# Patient Record
Sex: Male | Born: 1965 | Race: Black or African American | Hispanic: No | Marital: Married | Smoking: Never smoker
Health system: Southern US, Community
[De-identification: ages and names within clinical notes are randomized; demographics above are authoritative.]

## PROBLEM LIST (undated history)

## (undated) DIAGNOSIS — E119 Type 2 diabetes mellitus without complications: Secondary | ICD-10-CM

## (undated) DIAGNOSIS — Z7689 Persons encountering health services in other specified circumstances: Secondary | ICD-10-CM

## (undated) DIAGNOSIS — R42 Dizziness and giddiness: Secondary | ICD-10-CM

## (undated) DIAGNOSIS — K625 Hemorrhage of anus and rectum: Secondary | ICD-10-CM

## (undated) DIAGNOSIS — I10 Essential (primary) hypertension: Secondary | ICD-10-CM

## (undated) DIAGNOSIS — M1990 Unspecified osteoarthritis, unspecified site: Secondary | ICD-10-CM

## (undated) HISTORY — PX: CIRCUMCISION: SUR203

## (undated) HISTORY — DX: Hemorrhage of anus and rectum: K62.5

---

## 2009-02-07 ENCOUNTER — Encounter: Payer: Self-pay | Admitting: Orthopedic Surgery

## 2009-02-07 ENCOUNTER — Ambulatory Visit (HOSPITAL_COMMUNITY): Admission: RE | Admit: 2009-02-07 | Discharge: 2009-02-07 | Payer: Self-pay | Admitting: Family Medicine

## 2009-03-14 ENCOUNTER — Ambulatory Visit: Payer: Self-pay | Admitting: Orthopedic Surgery

## 2009-03-14 DIAGNOSIS — M25519 Pain in unspecified shoulder: Secondary | ICD-10-CM | POA: Insufficient documentation

## 2010-06-13 ENCOUNTER — Ambulatory Visit (HOSPITAL_COMMUNITY)
Admission: RE | Admit: 2010-06-13 | Discharge: 2010-06-13 | Payer: Self-pay | Source: Home / Self Care | Attending: Family Medicine | Admitting: Family Medicine

## 2014-02-08 ENCOUNTER — Other Ambulatory Visit (HOSPITAL_COMMUNITY): Payer: Self-pay | Admitting: Family Medicine

## 2014-02-08 ENCOUNTER — Ambulatory Visit (HOSPITAL_COMMUNITY)
Admission: RE | Admit: 2014-02-08 | Discharge: 2014-02-08 | Disposition: A | Discharge status: Home / Self Care | Payer: BC Managed Care – PPO | Source: Ambulatory Visit | Attending: Family Medicine | Admitting: Family Medicine

## 2014-02-08 DIAGNOSIS — M79609 Pain in unspecified limb: Secondary | ICD-10-CM | POA: Diagnosis present

## 2014-02-08 DIAGNOSIS — M79672 Pain in left foot: Secondary | ICD-10-CM

## 2015-02-15 ENCOUNTER — Other Ambulatory Visit (HOSPITAL_COMMUNITY): Payer: Self-pay | Admitting: Internal Medicine

## 2015-02-15 DIAGNOSIS — R42 Dizziness and giddiness: Secondary | ICD-10-CM

## 2015-02-16 ENCOUNTER — Ambulatory Visit (HOSPITAL_COMMUNITY)
Admission: RE | Admit: 2015-02-16 | Discharge: 2015-02-16 | Disposition: A | Discharge status: Home / Self Care | Payer: BLUE CROSS/BLUE SHIELD | Source: Ambulatory Visit | Attending: Internal Medicine | Admitting: Internal Medicine

## 2015-02-16 DIAGNOSIS — R42 Dizziness and giddiness: Secondary | ICD-10-CM | POA: Diagnosis not present

## 2015-02-16 DIAGNOSIS — I6523 Occlusion and stenosis of bilateral carotid arteries: Secondary | ICD-10-CM | POA: Diagnosis not present

## 2015-05-22 ENCOUNTER — Emergency Department (HOSPITAL_COMMUNITY)
Admission: EM | Admit: 2015-05-22 | Discharge: 2015-05-22 | Disposition: A | Discharge status: Home / Self Care | Payer: BLUE CROSS/BLUE SHIELD | Attending: Emergency Medicine | Admitting: Emergency Medicine

## 2015-05-22 ENCOUNTER — Emergency Department (HOSPITAL_COMMUNITY): Payer: BLUE CROSS/BLUE SHIELD

## 2015-05-22 ENCOUNTER — Encounter (HOSPITAL_COMMUNITY): Payer: Self-pay | Admitting: Emergency Medicine

## 2015-05-22 DIAGNOSIS — Z7982 Long term (current) use of aspirin: Secondary | ICD-10-CM | POA: Insufficient documentation

## 2015-05-22 DIAGNOSIS — I10 Essential (primary) hypertension: Secondary | ICD-10-CM | POA: Diagnosis not present

## 2015-05-22 DIAGNOSIS — K5731 Diverticulosis of large intestine without perforation or abscess with bleeding: Secondary | ICD-10-CM | POA: Diagnosis not present

## 2015-05-22 DIAGNOSIS — Z79899 Other long term (current) drug therapy: Secondary | ICD-10-CM | POA: Diagnosis not present

## 2015-05-22 DIAGNOSIS — R197 Diarrhea, unspecified: Secondary | ICD-10-CM | POA: Diagnosis present

## 2015-05-22 DIAGNOSIS — N289 Disorder of kidney and ureter, unspecified: Secondary | ICD-10-CM | POA: Insufficient documentation

## 2015-05-22 DIAGNOSIS — K922 Gastrointestinal hemorrhage, unspecified: Secondary | ICD-10-CM

## 2015-05-22 DIAGNOSIS — K5730 Diverticulosis of large intestine without perforation or abscess without bleeding: Secondary | ICD-10-CM

## 2015-05-22 HISTORY — DX: Dizziness and giddiness: R42

## 2015-05-22 HISTORY — DX: Essential (primary) hypertension: I10

## 2015-05-22 LAB — COMPREHENSIVE METABOLIC PANEL
ALBUMIN: 4.1 g/dL (ref 3.5–5.0)
ALK PHOS: 39 U/L (ref 38–126)
ALT: 56 U/L (ref 17–63)
ANION GAP: 8 (ref 5–15)
AST: 44 U/L — ABNORMAL HIGH (ref 15–41)
BUN: 17 mg/dL (ref 6–20)
CALCIUM: 8.6 mg/dL — AB (ref 8.9–10.3)
CO2: 27 mmol/L (ref 22–32)
Chloride: 101 mmol/L (ref 101–111)
Creatinine, Ser: 2.08 mg/dL — ABNORMAL HIGH (ref 0.61–1.24)
GFR calc Af Amer: 42 mL/min — ABNORMAL LOW (ref >60)
GFR calc non Af Amer: 36 mL/min — ABNORMAL LOW (ref >60)
GLUCOSE: 153 mg/dL — AB (ref 65–99)
Potassium: 3.6 mmol/L (ref 3.5–5.1)
SODIUM: 136 mmol/L (ref 135–145)
Total Bilirubin: 0.8 mg/dL (ref 0.3–1.2)
Total Protein: 7.2 g/dL (ref 6.5–8.1)

## 2015-05-22 LAB — URINALYSIS, ROUTINE W REFLEX MICROSCOPIC
Bilirubin Urine: NEGATIVE
GLUCOSE, UA: NEGATIVE mg/dL
Hgb urine dipstick: NEGATIVE
KETONES UR: NEGATIVE mg/dL
LEUKOCYTES UA: NEGATIVE
Nitrite: NEGATIVE
PH: 6.5 (ref 5.0–8.0)
Protein, ur: NEGATIVE mg/dL

## 2015-05-22 LAB — CBC WITH DIFFERENTIAL/PLATELET
BASOS PCT: 0 %
Basophils Absolute: 0 10*3/uL (ref 0.0–0.1)
EOS ABS: 0.1 10*3/uL (ref 0.0–0.7)
Eosinophils Relative: 1 %
HCT: 37.5 % — ABNORMAL LOW (ref 39.0–52.0)
HEMOGLOBIN: 13.9 g/dL (ref 13.0–17.0)
Lymphocytes Relative: 24 %
Lymphs Abs: 2 10*3/uL (ref 0.7–4.0)
MCH: 32.6 pg (ref 26.0–34.0)
MCHC: 37 g/dL — AB (ref 30.0–36.0)
MCV: 88 fL (ref 78.0–100.0)
Monocytes Absolute: 0.8 10*3/uL (ref 0.1–1.0)
Monocytes Relative: 10 %
NEUTROS PCT: 65 %
Neutro Abs: 5.4 10*3/uL (ref 1.7–7.7)
Platelets: 184 10*3/uL (ref 150–400)
RBC: 4.26 MIL/uL (ref 4.22–5.81)
RDW: 12.9 % (ref 11.5–15.5)
WBC: 8.3 10*3/uL (ref 4.0–10.5)

## 2015-05-22 MED ORDER — SODIUM CHLORIDE 0.9 % IV SOLN
INTRAVENOUS | Status: DC
  Administered 2015-05-22: 20:00:00 via INTRAVENOUS

## 2015-05-22 MED ORDER — BARIUM SULFATE 2 % PO SUSP: 450.0000 mL | Freq: Once | ORAL | Status: DC

## 2015-05-22 NOTE — Discharge Instructions (Signed)
°Emergency Department Resource Guide °1) Find a Doctor and Pay Out of Pocket °Although you won't have to find out who is covered by your insurance plan, it is a good idea to ask around and get recommendations. You will then need to call the office and see if the doctor you have chosen will accept you as a new patient and what types of options they offer for patients who are self-pay. Some doctors offer discounts or will set up payment plans for their patients who do not have insurance, but you will need to ask so you aren't surprised when you get to your appointment. ° °2) Contact Your Local Health Department °Not all health departments have doctors that can see patients for sick visits, but many do, so it is worth a call to see if yours does. If you don't know where your local health department is, you can check in your phone book. The CDC also has a tool to help you locate your state's health department, and many state websites also have listings of all of their local health departments. ° °3) Find a Walk-in Clinic °If your illness is not likely to be very severe or complicated, you may want to try a walk in clinic. These are popping up all over the country in pharmacies, drugstores, and shopping centers. They're usually staffed by nurse practitioners or physician assistants that have been trained to treat common illnesses and complaints. They're usually fairly quick and inexpensive. However, if you have serious medical issues or chronic medical problems, these are probably not your best option. ° °No Primary Care Doctor: °- Call [facility] at  832-8000 - they can help you locate a primary care doctor that  accepts your insurance, provides certain services, etc. °- Physician Referral Service- 1-800-533-3463 ° °Chronic Pain Problems: °Organization         Address  Phone   Notes  °[facility]  (336) [phone] Patients need to be referred by their primary care doctor.  ° °Medication  Assistance: °Organization         Address  Phone   Notes  °[place] [facility] [address] °[place], [place] [address] (336) 641-8030 --Must be a resident of [place] °-- Must have NO insurance coverage whatsoever (no Medicaid/ Medicare, etc.) °-- The pt. MUST have a primary care doctor that directs their care regularly and follows them in the community °  °[facility]  (866) 331-1348   °[employer]  (888) 892-1162   ° °Agencies that provide inexpensive medical care: °Organization         Address  Phone   Notes  °[facility]  (336) 832-8035   °[facility]    (336) 832-7272   °[facility] [address] °[place], [place] [address] (336) 832-4777   °[facility] [address], °[place] (336) 271-4999   °[facility]    (336) 373-0678   °[facility]    (336) 272-1050   °[facility] ° [address], [place] Phone:  (336) 832-4444, Fax:  (336) 832-4440 Hours of Operation:  9 am - 6 pm, M-F.  Also accepts Medicaid/Medicare and self-pay.  °[facility] ° [address], [place] Phone: (336) 832-3150, Fax: (336) [phone]. Hours of Operation:  8:30 am - 5:30 pm, M-F.  Also accepts Medicaid and self-pay.  °[facility] [place] [address], [place] Phone: (336) 878-6027   °[facility] [address], [place], [place] (336)723-1848, Ext. 123 Mondays & Thursdays: 7-9 AM.  First 15 patients are seen on a first come, first serve basis. °  ° °Medicaid-accepting [place] Providers: ° °Organization         Address  Phone   Notes  °[facility] [address], [place] (336) 641-2100 Also accepts self-pay patients.  °[facility] [address], [place] ° (336) 856-9996   °[facility] [address], [place]  (336) 288-8857   °[facility] [address] [place] [address], [place] (336) 299-7000   °Veita Bland [address], [place]  ° (336) 373-1557 Only accepts [place] Medicaid patients after they have their name applied to their card.  ° °Self-Pay (no insurance) in [place]: ° °Organization         Address  Phone   Notes  °Sickle Cell Patients, [facility] [address], [place] (336) 832-1970   °[facility] [address], [place] (336) 832-4400   °[facility] ° [address], [place] (336) 992-4800   °[facility]/Dr. Osei-Bonsu ° [address] [place] [address], [place] or [address], [place] (336) [phone] Phone number for both [place] and [place] locations is the same.  °[facility] [address], [place] (336) 299-0000   °[facility] [address] [place] [address], [place] or [address] (336) 852-7530 °(336) 878-2260   °[facility] [address], [place] (336) 350-1642, phone; (336) 294-5005, fax Sees patients 1st and 3rd Saturday of every month.  Must not qualify for public or private insurance (i.e. Medicaid, Medicare, [place] Health Choice, Veterans' Benefits) • Household income should be no more than 200% of the poverty level •The clinic cannot treat you if you are pregnant or think you are pregnant • Sexually transmitted diseases are not treated at the clinic.  ° ° °Dental Care: °Organization         Address  Phone  Notes  °[place] [facility] [address], [place] (336) 641-6152 Accepts children up to age 21 who are enrolled in Medicaid or [place] Health Choice; pregnant women with a Medicaid card; and children who have applied for Medicaid or [place] Health Choice, but were declined, whose parents can pay a reduced fee at time of service.  °[place]  [facility] [place]  [address], [place] (336) 641-7733 Accepts children up to age 21 who are enrolled in Medicaid or  Health Choice; pregnant women with a Medicaid card; and children who have applied for Medicaid or  Health Choice, but were declined, whose parents can pay a reduced fee at time of service.  °[facility] ° [address], [place] (336) 641-4533 Patients are seen by appointment only. Walk-ins are not accepted. [facility] will see patients 18 years of age and older. °Monday - Tuesday (8am-5pm) °Most Wednesdays (8:30-5pm) °$30 per visit, cash only  °[facility] ° [address], [place] (336) 641-4533 Patients are seen by appointment only. Walk-ins are not accepted. [facility] will see patients 18 years of age and older. °One   Wednesday Evening (Monthly: Volunteer Based).  $30 per visit, cash only  °[facility]  (919) 537-3737 for adults; Children under age 4, call Graduate Pediatric Dentistry at ([phone]) [phone]. Children aged 4-14, please call (919) [phone] to request a pediatric application. ° Dental services are provided in all areas of dental care including fillings, crowns and bridges, complete and partial dentures, implants, gum treatment, root canals, and extractions. Preventive care is also provided. Treatment is provided to both adults and children. °Patients are selected via a lottery and there is often a waiting list. °  °[facility] [address], °[place] ° (336) 763-8833 [email or web address] °  °[facility] [address], [place], [place] (336)723-1848, Ext. 123 Second and Fourth Thursday of each month, opens at 6:30 AM; Clinic ends at 9 AM.  Patients are seen on a first-come first-served basis, and a limited number are seen during each clinic.  ° °[facility] ° [address], [place], [place] (336) [phone]    Eligibility Requirements °You must have lived in [place], or [place] counties for at least the last three months. °  You cannot be eligible for state or federal sponsored healthcare insurance, including Veterans Administration, Medicaid, or Medicare. °  You generally cannot be eligible for healthcare insurance through your employer.  °  How to apply: °Eligibility screenings are held every Tuesday and Wednesday afternoon from 1:00 pm until 4:00 pm. You do not need an appointment for the interview!  °[facility] [address], [place], [place] 336-631-2330   °[facility]  336-342-8273   °[facility]  336-703-3100   °[facility]  336-570-6415   ° °Behavioral Health Resources in the Community: °Intensive Outpatient Programs °Organization         Address  Phone  Notes  °[facility] [address], [place], [place] 336-878-6098   °[facility] [address], [place], [place] 336-832-9800   °[facility] [address], [place], [place] ° 336-882-2125   °[facility] [address],  °[place], [place] 1-800-853-5163 or 336-641-4981   °Substance Abuse Resources °Organization         Address  Phone  Notes  °[facility]  336-882-2125   °[facility]  336-784-9470   °[facility]  336-285-9073   °[facility]  336-845-3988   °Residential & Outpatient Substance Abuse Program  1-800-659-3381   °Psychological Services °Organization         Address  Phone  Notes  °[facility]  336- 832-9600   °[facility]  336- 378-7881   °[facility] [address], [place] 1-800-853-5163 or 336-641-4981   ° °Mobile Crisis Teams °Organization         Address  Phone  Notes  °[facility], Mobile Crisis Care Unit  1-877-626-1772   °Assertive °[facility] ° [address]  [place], [place] 336-834-9664   °Sharon DeEsch [address] ° [place] 336-554-5454   ° °Self-Help/Support Groups °Organization         Address  Phone             Notes  °[facility]  - variety of support groups  336- 373-1402 Call for more information  °Narcotics Anonymous (NA), [facility] [address], °[place]   2 meetings at this location  ° °  Residential Treatment Programs Organization         Address  Phone  Notes  [facility] [address],    [place]  1-681-102-9788   [facility]  [address], [place] [id number], [place], [place]   [place], [place] [phone] Admissions: 8am-3pm M-F  [facility] [place] [address],    [place], [place] [phone]   [facility] [address] [place], [place], [place]   [facility] [address],  [place], [place]   [facility] Intensive Outpatient [address], [place], [place]   [facility] [address]  [place], [place] 1-870-416-7895 or 662-382-3241   [facility] [address], [place], [place] Accepts Medicaid  [facility] [address],  [place] 1-4066938711 Substance Abuse/Addiction Treatment   [facility]         Address  Phone  Notes  [facility]  380-869-6514   Domenic Schwab, PhD [address] [place] [place], [place]   626-642-4352 or 913-102-2174   [place], [place] (956) [phone]   [facility] [address] [place], [place] (512)518-5814 Insurance/Medicaid/sponsorship through [facility] [address]                                    [place], [place] [phone] [place] [address], [place] (973) [phone]    Dr. Adele Schilder  ([phone]   [facility] Dept. 1) [address] [place] 2) [place] 3)  [address], [place] [phone]) [phone]  [phone]   [place] (409)[phone] or [phone] (After Hours)      Your renal function tests were elevated today and you were given IV fluids. Call your regular medical doctor Monday to schedule a follow up appointment within the next 2 days to re-check these labs, as well as your hemoglobin level. Call the GI doctor tomorrow to schedule a follow up appointment this week regarding the diverticulosis seen on your CT scan.  Return to the Emergency Department immediately sooner if worsening.

## 2015-05-22 NOTE — ED Notes (Addendum)
PT c/o bright red blood noted with bowel movements x3 days. PT states he take a 81mg  aspirin daily.

## 2015-05-22 NOTE — ED Provider Notes (Signed)
CSN: [id number]     Arrival date & time 05/22/15  1751 History   First MD Initiated Contact with Patient 05/22/15 1903     Chief Complaint  Patient presents with  . Rectal Bleeding      HPI Pt was seen at 1905. Per pt, c/o gradual onset and persistence of multiple intermittent episodes of diarrhea that began today. Pt states he "strained to have a BM" initially this morning, and saw "streaks of blood on the brown stool" afterwards. Pt then had 2 episodes of diarrhea today, both with "blood in the toilet and on the stool." Has been associated with mild lower abd "pain." Denies back pain, no CP/SOB, no fevers, no rash, no rectal pain, no N/V, no testicular pain/swelling. Denies excessive ASA, BC poweder or NSAID use and is not on anti-coagulants.      Past Medical History  Diagnosis Date  . Vertigo   . Hypertension    Past Surgical History  Procedure Laterality Date  . Circumcision      Social History  Substance Use Topics  . Smoking status: Never Smoker   . Smokeless tobacco: None  . Alcohol Use: Yes     Comment: 3 times a week 3 beers    Review of Systems ROS: Statement: All systems negative except as marked or noted in the HPI; Constitutional: Negative for fever and chills. ; ; Eyes: Negative for eye pain, redness and discharge. ; ; ENMT: Negative for ear pain, hoarseness, nasal congestion, sinus pressure and sore throat. ; ; Cardiovascular: Negative for chest pain, palpitations, diaphoresis, dyspnea and peripheral edema. ; ; Respiratory: Negative for cough, wheezing and stridor. ; ; Gastrointestinal: Negative for nausea, vomiting, hematemesis, jaundice and +diarrhea, abd pain, blood in stool, rectal bleeding. . ; ; Genitourinary: Negative for dysuria, flank pain and hematuria. ; ; Musculoskeletal: Negative for back pain and neck pain. Negative for swelling and trauma.; ; Skin: Negative for pruritus, rash, abrasions, blisters, bruising and skin lesion.; ; Neuro: Negative for  headache, lightheadedness and neck stiffness. Negative for weakness, altered level of consciousness , altered mental status, extremity weakness, paresthesias, involuntary movement, seizure and syncope.      Allergies  Review of patient's allergies indicates no known allergies.  Home Medications   Prior to Admission medications   Medication Sig Start Date End Date Taking? Authorizing Provider  amLODipine (NORVASC) 10 MG tablet Take 10 mg by mouth daily. 03/01/15  Yes Historical Provider, MD  aspirin 81 MG EC tablet Take 81 mg by mouth daily.   Yes Historical Provider, MD  GLIPIZIDE XL 2.5 MG 24 hr tablet Take 2.5 mg by mouth daily. 04/05/15  Yes Historical Provider, MD  losartan (COZAAR) 100 MG tablet Take 100 mg by mouth daily. 03/01/15  Yes Historical Provider, MD  meclizine (ANTIVERT) 25 MG tablet Take 25 mg by mouth daily. 03/29/15  Yes Historical Provider, MD  metoprolol succinate (TOPROL XL) 50 MG 24 hr tablet Take 50 mg by mouth daily. 03/01/15  Yes Historical Provider, MD   BP 147/86 mmHg  Pulse 70  Temp(Src) 98 F (36.7 C) (Oral)  Resp 20  Ht 5\' 7"  (1.702 m)  Wt 205 lb (92.987 kg)  BMI 32.10 kg/m2  SpO2 100% Physical Exam  1910: Physical examination:  Nursing notes reviewed; Vital signs and O2 SAT reviewed;  Constitutional: Well developed, Well nourished, Well hydrated, In no acute distress; Head:  Normocephalic, atraumatic; Eyes: EOMI, PERRL, No scleral icterus; ENMT: Mouth and pharynx normal, Mucous membranes  moist; Neck: Supple, Full range of motion, No lymphadenopathy; Cardiovascular: Regular rate and rhythm, No murmur, rub, or gallop; Respiratory: Breath sounds clear & equal bilaterally, No rales, rhonchi, wheezes.  Speaking full sentences with ease, Normal respiratory effort/excursion; Chest: Nontender, Movement normal; Abdomen: Soft, +mild LLQ tenderness to palp. No rebound or guarding. Nondistended, Normal bowel sounds. Rectal exam performed w/permission of pt and ED chaperone  present.  Anal tone normal.  Non-tender, soft red stool in rectal vault, heme positive.  No fissures, no external hemorrhoids, no palp masses.; Genitourinary: No CVA tenderness; Extremities: Pulses normal, No tenderness, No edema, No calf edema or asymmetry.; Neuro: AA&Ox3, Major CN grossly intact.  Speech clear. No gross focal motor or sensory deficits in extremities. Climbs on and off stretcher easily by himself. Gait steady.; Skin: Color normal, Warm, Dry.   ED Course  Procedures (including critical care time) Labs Review   Imaging Review  I have personally reviewed and evaluated these images and lab results as part of my medical decision-making.   EKG Interpretation None      MDM  MDM Reviewed: nursing note and vitals Reviewed previous: labs Interpretation: labs and CT scan     Results for orders placed or performed during the hospital encounter of 05/22/15  CBC with Differential  Result Value Ref Range   WBC 8.3 4.0 - 10.5 K/uL   RBC 4.26 4.22 - 5.81 MIL/uL   Hemoglobin 13.9 13.0 - 17.0 g/dL   HCT 37.5 (L) 39.0 - 52.0 %   MCV 88.0 78.0 - 100.0 fL   MCH 32.6 26.0 - 34.0 pg   MCHC 37.0 (H) 30.0 - 36.0 g/dL   RDW 12.9 11.5 - 15.5 %   Platelets 184 150 - 400 K/uL   Neutrophils Relative % 65 %   Neutro Abs 5.4 1.7 - 7.7 K/uL   Lymphocytes Relative 24 %   Lymphs Abs 2.0 0.7 - 4.0 K/uL   Monocytes Relative 10 %   Monocytes Absolute 0.8 0.1 - 1.0 K/uL   Eosinophils Relative 1 %   Eosinophils Absolute 0.1 0.0 - 0.7 K/uL   Basophils Relative 0 %   Basophils Absolute 0.0 0.0 - 0.1 K/uL  Comprehensive metabolic panel  Result Value Ref Range   Sodium 136 135 - 145 mmol/L   Potassium 3.6 3.5 - 5.1 mmol/L   Chloride 101 101 - 111 mmol/L   CO2 27 22 - 32 mmol/L   Glucose, Bld 153 (H) 65 - 99 mg/dL   BUN 17 6 - 20 mg/dL   Creatinine, Ser 2.08 (H) 0.61 - 1.24 mg/dL   Calcium 8.6 (L) 8.9 - 10.3 mg/dL   Total Protein 7.2 6.5 - 8.1 g/dL   Albumin 4.1 3.5 - 5.0 g/dL   AST 44  (H) 15 - 41 U/L   ALT 56 17 - 63 U/L   Alkaline Phosphatase 39 38 - 126 U/L   Total Bilirubin 0.8 0.3 - 1.2 mg/dL   GFR calc non Af Amer 36 (L) >60 mL/min   GFR calc Af Amer 42 (L) >60 mL/min   Anion gap 8 5 - 15  Urinalysis, Routine w reflex microscopic  Result Value Ref Range   Color, Urine STRAW (A) YELLOW   APPearance CLEAR CLEAR   Specific Gravity, Urine <1.005 (L) 1.005 - 1.030   pH 6.5 5.0 - 8.0   Glucose, UA NEGATIVE NEGATIVE mg/dL   Hgb urine dipstick NEGATIVE NEGATIVE   Bilirubin Urine NEGATIVE NEGATIVE   Ketones, ur  NEGATIVE NEGATIVE mg/dL   Protein, ur NEGATIVE NEGATIVE mg/dL   Nitrite NEGATIVE NEGATIVE   Leukocytes, UA NEGATIVE NEGATIVE   Ct Abdomen Pelvis Wo Contrast 05/22/2015  CLINICAL DATA:  Acute onset of bright red blood per rectum. Initial encounter. EXAM: CT ABDOMEN AND PELVIS WITHOUT CONTRAST TECHNIQUE: Multidetector CT imaging of the abdomen and pelvis was performed following the standard protocol without IV contrast. COMPARISON:  None. FINDINGS: The visualized lung bases are clear. The liver and spleen are unremarkable in appearance. The gallbladder is within normal limits. The pancreas and adrenal glands are unremarkable. Nonspecific perinephric stranding is noted bilaterally. A 1.5 cm cyst is noted at the medial aspect of the right kidney. There is no evidence of hydronephrosis. No renal or ureteral stones are identified. No free fluid is identified. The small bowel is unremarkable in appearance. The stomach is within normal limits. No acute vascular abnormalities are seen. The appendix is normal in caliber and contains air, without evidence of appendicitis. Scattered diverticulosis is noted along the ascending colon. The colon is otherwise unremarkable. The bladder is mildly distended and grossly unremarkable. The prostate remains normal in size. No inguinal lymphadenopathy is seen. No acute osseous abnormalities are identified. IMPRESSION: 1. No acute abnormality  seen within the abdomen or pelvis. 2. Nonspecific bilateral perinephric stranding noted. Would correlate clinically for any evidence of pyelonephritis. 3. Scattered diverticulosis along the ascending colon. 4. Right renal cyst noted. Electronically Signed   By: Garald Balding M.D.   On: 05/22/2015 21:31    2310:  BUN/Cr elevated, no old to compare; IVF given, f/u PMD to re-check this week. CT scan reassuring and H/H normal; will need f/u with GI MD. Pt has tol PO well while in the ED without N/V.  No stooling while in the ED.  Abd benign, VSS. Feels better and wants to go home now. Strict return precautions given. Dx and testing d/w pt and family.  Questions answered.  Verb understanding, agreeable to d/c home with outpt f/u.    Francine Graven, DO 05/24/15 [phone]

## 2015-05-23 LAB — POC OCCULT BLOOD, ED: Fecal Occult Bld: POSITIVE — AB

## 2015-05-24 LAB — URINE CULTURE: Culture: NO GROWTH

## 2015-05-26 ENCOUNTER — Encounter (INDEPENDENT_AMBULATORY_CARE_PROVIDER_SITE_OTHER): Payer: Self-pay | Admitting: Internal Medicine

## 2015-05-26 ENCOUNTER — Telehealth (INDEPENDENT_AMBULATORY_CARE_PROVIDER_SITE_OTHER): Payer: Self-pay | Admitting: *Deleted

## 2015-05-26 ENCOUNTER — Encounter (INDEPENDENT_AMBULATORY_CARE_PROVIDER_SITE_OTHER): Payer: Self-pay | Admitting: *Deleted

## 2015-05-26 ENCOUNTER — Other Ambulatory Visit (INDEPENDENT_AMBULATORY_CARE_PROVIDER_SITE_OTHER): Payer: Self-pay | Admitting: Internal Medicine

## 2015-05-26 ENCOUNTER — Ambulatory Visit (INDEPENDENT_AMBULATORY_CARE_PROVIDER_SITE_OTHER): Payer: BLUE CROSS/BLUE SHIELD | Admitting: Internal Medicine

## 2015-05-26 VITALS — BP 112/64 | HR 64 | Temp 98.0°F | Ht 67.0 in | Wt 204.1 lb

## 2015-05-26 DIAGNOSIS — K625 Hemorrhage of anus and rectum: Secondary | ICD-10-CM | POA: Diagnosis not present

## 2015-05-26 DIAGNOSIS — Z1211 Encounter for screening for malignant neoplasm of colon: Secondary | ICD-10-CM

## 2015-05-26 NOTE — Patient Instructions (Signed)
The risks and benefits such as perforation, bleeding, and infection were reviewed with the patient and is agreeable. 

## 2015-05-26 NOTE — Progress Notes (Signed)
Subjective:    Patient ID: David Harris, male    DOB: [birth year], 49 y.o.   MRN: [id number]  HPI Referred by [facility] for rectal bleeding. Seen in the ED 05/22/2015 for rectal bleeding. Evaluated and felt stable to be discharged. He has had rectal bleeding off and on x 2 months. Describes as a Forensic psychologist. No abdominal pain associated with the bleeding. He says he was not straining when the bleeding occurred.  Hemoglobin in the ED was 13.9, He had been seeing streaks of blood on his brown stool.  He also saw blood in the toilet. He says the day he presented to the ED he had been straining to have a BM.  He continues to see a small amt of blood.  Stool are soft. He denies prior hx of constipation.  He stools was guaiac positive in the ED No ASA, BC powders or NSAIDs.  No family hx of colon cancer. Appetite is good. No weight loss. No abdominal pain.    05/22/2015 CT abdomen/pelvis without CM:  IMPRESSION: 1. No acute abnormality seen within the abdomen or pelvis. 2. Nonspecific bilateral perinephric stranding noted. Would correlate clinically for any evidence of pyelonephritis. 3. Scattered diverticulosis along the ascending colon. 4. Right renal cyst noted.  CBC    Component Value Date/Time   WBC 8.3 05/22/2015 1911   RBC 4.26 05/22/2015 1911   HGB 13.9 05/22/2015 1911   HCT 37.5* 05/22/2015 1911   PLT 184 05/22/2015 1911   MCV 88.0 05/22/2015 1911   MCH 32.6 05/22/2015 1911   MCHC 37.0* 05/22/2015 1911   RDW 12.9 05/22/2015 1911   LYMPHSABS 2.0 05/22/2015 1911   MONOABS 0.8 05/22/2015 1911   EOSABS 0.1 05/22/2015 1911   BASOSABS 0.0 05/22/2015 1911       Review of Systems Past Medical History  Diagnosis Date  . Vertigo   . Hypertension   . Rectal bleeding     Past Surgical History  Procedure Laterality Date  . Circumcision      No Known Allergies  Current Outpatient Prescriptions on File Prior to Visit  Medication Sig Dispense Refill  .  amLODipine (NORVASC) 10 MG tablet Take 10 mg by mouth daily.    Marland Kitchen aspirin 81 MG EC tablet Take 81 mg by mouth daily.    Marland Kitchen GLIPIZIDE XL 2.5 MG 24 hr tablet Take 2.5 mg by mouth as needed.   11  . losartan (COZAAR) 100 MG tablet Take 100 mg by mouth daily.    . meclizine (ANTIVERT) 25 MG tablet Take 25 mg by mouth daily.  1  . metoprolol succinate (TOPROL XL) 50 MG 24 hr tablet Take 50 mg by mouth daily.     No current facility-administered medications on file prior to visit.        Objective:   Physical Exam Blood pressure 112/64, pulse 64, temperature 98 F (36.7 C), height 5\' 7"  (1.702 m), weight 204 lb 1.6 oz (92.579 kg).  Alert and oriented. Skin warm and dry. Oral mucosa is moist.   . Sclera anicteric, conjunctivae is pink. Thyroid not enlarged. No cervical lymphadenopathy. Lungs clear. Heart regular rate and rhythm.  Abdomen is soft. Bowel sounds are positive. No hepatomegaly. No abdominal masses felt. No tenderness.  No edema to lower extremities.  Stool brown and guaiac negative.   Lot [id number] Ex 9/17      Assessment & Plan:  Rectal bleeding. Colonic neoplasm needs to be ruled out. Diverticular bleed in  the differential.  The risks and benefits such as perforation, bleeding, and infection were reviewed with the patient and is agreeable.

## 2015-05-26 NOTE — Telephone Encounter (Signed)
Patient needs trilyte 

## 2015-05-30 MED ORDER — PEG 3350-KCL-NA BICARB-NACL 420 G PO SOLR: 4000.0000 mL | Freq: Once | ORAL | Status: DC

## 2015-06-13 ENCOUNTER — Encounter (HOSPITAL_COMMUNITY): Payer: Self-pay | Admitting: *Deleted

## 2015-06-13 ENCOUNTER — Ambulatory Visit (HOSPITAL_COMMUNITY)
Admission: RE | Admit: 2015-06-13 | Discharge: 2015-06-13 | Disposition: A | Discharge status: Home / Self Care | Payer: BLUE CROSS/BLUE SHIELD | Source: Ambulatory Visit | Attending: Internal Medicine | Admitting: Internal Medicine

## 2015-06-13 ENCOUNTER — Encounter (HOSPITAL_COMMUNITY)
Admission: RE | Disposition: A | Discharge status: Home / Self Care | Payer: Self-pay | Source: Ambulatory Visit | Attending: Internal Medicine

## 2015-06-13 DIAGNOSIS — D1779 Benign lipomatous neoplasm of other sites: Secondary | ICD-10-CM | POA: Diagnosis not present

## 2015-06-13 DIAGNOSIS — K649 Unspecified hemorrhoids: Secondary | ICD-10-CM | POA: Insufficient documentation

## 2015-06-13 DIAGNOSIS — E119 Type 2 diabetes mellitus without complications: Secondary | ICD-10-CM | POA: Diagnosis not present

## 2015-06-13 DIAGNOSIS — Z79899 Other long term (current) drug therapy: Secondary | ICD-10-CM | POA: Diagnosis not present

## 2015-06-13 DIAGNOSIS — K625 Hemorrhage of anus and rectum: Secondary | ICD-10-CM

## 2015-06-13 DIAGNOSIS — K6289 Other specified diseases of anus and rectum: Secondary | ICD-10-CM | POA: Diagnosis not present

## 2015-06-13 DIAGNOSIS — I10 Essential (primary) hypertension: Secondary | ICD-10-CM | POA: Diagnosis not present

## 2015-06-13 DIAGNOSIS — D128 Benign neoplasm of rectum: Secondary | ICD-10-CM

## 2015-06-13 DIAGNOSIS — Z7982 Long term (current) use of aspirin: Secondary | ICD-10-CM | POA: Diagnosis not present

## 2015-06-13 DIAGNOSIS — K5730 Diverticulosis of large intestine without perforation or abscess without bleeding: Secondary | ICD-10-CM

## 2015-06-13 DIAGNOSIS — K648 Other hemorrhoids: Secondary | ICD-10-CM | POA: Diagnosis not present

## 2015-06-13 HISTORY — PX: COLONOSCOPY: SHX5424

## 2015-06-13 HISTORY — DX: Type 2 diabetes mellitus without complications: E11.9

## 2015-06-13 LAB — GLUCOSE, CAPILLARY: Glucose-Capillary: 176 mg/dL — ABNORMAL HIGH (ref 65–99)

## 2015-06-13 SURGERY — COLONOSCOPY
Anesthesia: Moderate Sedation

## 2015-06-13 MED ORDER — MEPERIDINE HCL 50 MG/ML IJ SOLN
INTRAMUSCULAR | Status: DC | PRN
  Administered 2015-06-13 (×2): 25 mg via INTRAVENOUS

## 2015-06-13 MED ORDER — MIDAZOLAM HCL 5 MG/5ML IJ SOLN
INTRAMUSCULAR | Status: DC | PRN
  Administered 2015-06-13 (×5): 2 mg via INTRAVENOUS

## 2015-06-13 MED ORDER — MEPERIDINE HCL 50 MG/ML IJ SOLN
INTRAMUSCULAR | Status: DC
Start: 2015-06-13 — End: 2015-06-13
  Filled 2015-06-13: qty 1

## 2015-06-13 MED ORDER — SODIUM CHLORIDE 0.9 % IV SOLN
INTRAVENOUS | Status: DC
  Administered 2015-06-13: 07:00:00 via INTRAVENOUS

## 2015-06-13 MED ORDER — MIDAZOLAM HCL 5 MG/5ML IJ SOLN
INTRAMUSCULAR | Status: AC
  Filled 2015-06-13: qty 10

## 2015-06-13 MED ORDER — STERILE WATER FOR IRRIGATION IR SOLN
Status: DC | PRN
  Administered 2015-06-13: 08:00:00

## 2015-06-13 NOTE — Op Note (Signed)
COLONOSCOPY PROCEDURE REPORT  PATIENT:  David Harris  MR#:  [id number] Birthdate:  [birth year], 49 y.o., male Endoscopist:  Dr. Rogene Houston, MD Referred By:  Dr. Purvis Kilts, MD Procedure Date: 06/13/2015  Procedure:   Colonoscopy with snare polypectomy  Indications:  Patient is 49 year old African-American male who experienced rectal bleeding 3 weeks ago and was seen in emergency room CT revealed right-sided colonic diverticulosis. Hemoglobin was 13.9 g. He was discharged and has not had any more episodes of bleeding. Family history is negative for CRC.  Informed Consent:  The procedure and risks were reviewed with the patient and informed consent was obtained.  Medications:  Demerol 50 mg IV Versed 10 mg IV  Description of procedure:  After a digital rectal exam was performed, that colonoscope was advanced from the anus through the rectum and colon to the area of the cecum, ileocecal valve and appendiceal orifice. The cecum was deeply intubated. These structures were well-seen and photographed for the record. From the level of the cecum and ileocecal valve, the scope was slowly and cautiously withdrawn. The mucosal surfaces were carefully surveyed utilizing scope tip to flexion to facilitate fold flattening as needed. The scope was pulled down into the rectum where a thorough exam including retroflexion was performed.  Findings:  Prep satisfactory. Scattered diverticula at ascending and proximal transverse colon. 7 mm mucosal lipoma noted at rectum. 8 mm polyp hot snare from distal rectum.\ Small hemorrhoids below the dentate line.   Therapeutic/Diagnostic Maneuvers Performed:  See above  Complications: None  EBL: None  Cecal Withdrawal Time:  10 minutes  Impression:  Examination performed to cecum. Scattered diverticula at ascending and proximal transverse colon. 8 mm rectal polyp hot snared. 7 mm submucosal lipoma at rectum. Was left  alone.  Recommendations:  Standard instructions given. Aspirin or NSAIDs for 1 week. High fiber diet. I will contact patient with biopsy results and further recommendations.  REHMAN,NAJEEB U  06/13/2015 8:49 AM  CC: Dr. Ethlyn Gallery, Kassie Mends, MD & Dr. Rayne Du ref. provider found

## 2015-06-13 NOTE — Discharge Instructions (Signed)
No aspirin or NSAIDs for 1 week. Resume other medications and high fiber diet. No driving for 24 hours. Physician will call with biopsy results. Colonoscopy, Care After Refer to this sheet in the next few weeks. These instructions provide you with information on caring for yourself after your procedure. Your health care provider may also give you more specific instructions. Your treatment has been planned according to current medical practices, but problems sometimes occur. Call your health care provider if you have any problems or questions after your procedure. WHAT TO EXPECT AFTER THE PROCEDURE  After your procedure, it is typical to have the following:  A small amount of blood in your stool.  Moderate amounts of gas and mild abdominal cramping or bloating. HOME CARE INSTRUCTIONS  Do not drive, operate machinery, or sign important documents for 24 hours.  You may shower and resume your regular physical activities, but move at a slower pace for the first 24 hours.  Take frequent rest periods for the first 24 hours.  Walk around or put a warm pack on your abdomen to help reduce abdominal cramping and bloating.  Drink enough fluids to keep your urine clear or pale yellow.  You may resume your normal diet as instructed by your health care provider. Avoid heavy or fried foods that are hard to digest.  Avoid drinking alcohol for 24 hours or as instructed by your health care provider.  Only take over-the-counter or prescription medicines as directed by your health care provider.  If a tissue sample (biopsy) was taken during your procedure:. SEEK MEDICAL CARE IF: You have persistent spotting of blood in your stool 2-3 days after the procedure. SEEK IMMEDIATE MEDICAL CARE IF:  You have more than a small spotting of blood in your stool.  You pass large blood clots in your stool.  Your abdomen is swollen (distended).  You have nausea or vomiting.  You have a fever.  You have  increasing abdominal pain that is not relieved with medicine.   This information is not intended to replace advice given to you by your health care provider. Make sure you discuss any questions you have with your health care provider.   Document Released: 01/24/2004 Document Revised: 04/01/2013 Document Reviewed: 02/16/2013 Elsevier Interactive Patient Education [employer].

## 2015-06-13 NOTE — H&P (Addendum)
EMELIO YEAKEL is an 49 y.o. male.   Chief Complaint: Patient is here for colonoscopy. HPI: Patient is 49 year old African-American male who is here for diagnostic colonoscopy. Three weeks ago he had multiple episodes of rectal bleeding. He was seen in emergency room. H&H was normal. Abdominopelvic CT revealed a right-sided colonic diverticulosis. He was discharged. He has not had any more episodes of bleeding. He did not experience diarrhea constipation or abdominal pain. He has good appetite. Family stress is negative for CRC.  Past Medical History  Diagnosis Date  . Vertigo   . Hypertension   . Rectal bleeding   . Diabetes mellitus without complication [facility])     Past Surgical History  Procedure Laterality Date  . Circumcision      History reviewed. No pertinent family history. Social History:  reports that he has never smoked. He does not have any smokeless tobacco history on file. He reports that he drinks alcohol. He reports that he does not use illicit drugs.  Allergies: No Known Allergies  Medications Prior to Admission  Medication Sig Dispense Refill  . amLODipine (NORVASC) 10 MG tablet Take 10 mg by mouth daily.    Marland Kitchen aspirin 81 MG EC tablet Take 81 mg by mouth daily.    Marland Kitchen losartan (COZAAR) 100 MG tablet Take 100 mg by mouth daily.    . meclizine (ANTIVERT) 25 MG tablet Take 25 mg by mouth daily.  1  . metoprolol succinate (TOPROL XL) 50 MG 24 hr tablet Take 50 mg by mouth daily.    . polyethylene glycol-electrolytes (NULYTELY/GOLYTELY) 420 G solution Take 4,000 mLs by mouth once. 4000 mL 0  . GLIPIZIDE XL 2.5 MG 24 hr tablet Take 2.5 mg by mouth as needed.   11    Results for orders placed or performed during the hospital encounter of 06/13/15 (from the past 48 hour(s))  Glucose, capillary     Status: Abnormal   Collection Time: 06/13/15  6:55 AM  Result Value Ref Range   Glucose-Capillary 176 (H) 65 - 99 mg/dL   No results found.  ROS  Blood pressure 148/82,  pulse 60, temperature 98.8 F (37.1 C), temperature source Oral, resp. rate 21, height 5\' 7"  (1.702 m), weight 204 lb (92.534 kg), SpO2 100 %. Physical Exam  Constitutional: He appears well-developed and well-nourished.  HENT:  Mouth/Throat: Oropharynx is clear and moist.  Eyes: Conjunctivae are normal. No scleral icterus.  Neck: No thyromegaly present.  Cardiovascular: Normal rate and regular rhythm.   Murmur (grade 2/6 systolic ejection murmur best heard at left sternal border.) heard. Respiratory: Effort normal and breath sounds normal.  GI: Soft. He exhibits no distension and no mass. There is no tenderness.  Musculoskeletal: He exhibits no edema.  Lymphadenopathy:    He has no cervical adenopathy.  Neurological: He is alert.  Skin: Skin is warm and dry.     Assessment/Plan Rectal bleeding. Diagnostic colonoscopy.  REHMAN,NAJEEB U 06/13/2015, 8:08 AM

## 2015-06-21 ENCOUNTER — Encounter (HOSPITAL_COMMUNITY): Payer: Self-pay | Admitting: Internal Medicine

## 2015-11-18 DIAGNOSIS — E6609 Other obesity due to excess calories: Secondary | ICD-10-CM | POA: Diagnosis not present

## 2015-11-18 DIAGNOSIS — M5126 Other intervertebral disc displacement, lumbar region: Secondary | ICD-10-CM | POA: Diagnosis not present

## 2015-11-18 DIAGNOSIS — M5417 Radiculopathy, lumbosacral region: Secondary | ICD-10-CM | POA: Diagnosis not present

## 2015-11-18 DIAGNOSIS — Z1389 Encounter for screening for other disorder: Secondary | ICD-10-CM | POA: Diagnosis not present

## 2015-11-18 DIAGNOSIS — E1129 Type 2 diabetes mellitus with other diabetic kidney complication: Secondary | ICD-10-CM | POA: Diagnosis not present

## 2015-11-18 DIAGNOSIS — Z6833 Body mass index (BMI) 33.0-33.9, adult: Secondary | ICD-10-CM | POA: Diagnosis not present

## 2015-12-20 DIAGNOSIS — Z713 Dietary counseling and surveillance: Secondary | ICD-10-CM | POA: Diagnosis not present

## 2015-12-20 DIAGNOSIS — I10 Essential (primary) hypertension: Secondary | ICD-10-CM | POA: Diagnosis not present

## 2015-12-20 DIAGNOSIS — E669 Obesity, unspecified: Secondary | ICD-10-CM | POA: Diagnosis not present

## 2016-01-17 DIAGNOSIS — Z713 Dietary counseling and surveillance: Secondary | ICD-10-CM | POA: Diagnosis not present

## 2016-01-17 DIAGNOSIS — I10 Essential (primary) hypertension: Secondary | ICD-10-CM | POA: Diagnosis not present

## 2016-01-17 DIAGNOSIS — E669 Obesity, unspecified: Secondary | ICD-10-CM | POA: Diagnosis not present

## 2016-02-02 DIAGNOSIS — Z008 Encounter for other general examination: Secondary | ICD-10-CM | POA: Diagnosis not present

## 2016-02-02 DIAGNOSIS — E669 Obesity, unspecified: Secondary | ICD-10-CM | POA: Diagnosis not present

## 2016-02-02 DIAGNOSIS — E785 Hyperlipidemia, unspecified: Secondary | ICD-10-CM | POA: Diagnosis not present

## 2016-02-02 DIAGNOSIS — I10 Essential (primary) hypertension: Secondary | ICD-10-CM | POA: Diagnosis not present

## 2016-04-30 DIAGNOSIS — H8113 Benign paroxysmal vertigo, bilateral: Secondary | ICD-10-CM | POA: Diagnosis not present

## 2016-04-30 DIAGNOSIS — I10 Essential (primary) hypertension: Secondary | ICD-10-CM | POA: Diagnosis not present

## 2016-04-30 DIAGNOSIS — Z6832 Body mass index (BMI) 32.0-32.9, adult: Secondary | ICD-10-CM | POA: Diagnosis not present

## 2016-04-30 DIAGNOSIS — E119 Type 2 diabetes mellitus without complications: Secondary | ICD-10-CM | POA: Diagnosis not present

## 2016-04-30 DIAGNOSIS — Z1389 Encounter for screening for other disorder: Secondary | ICD-10-CM | POA: Diagnosis not present

## 2016-04-30 DIAGNOSIS — S0501XA Injury of conjunctiva and corneal abrasion without foreign body, right eye, initial encounter: Secondary | ICD-10-CM | POA: Diagnosis not present

## 2016-05-30 ENCOUNTER — Ambulatory Visit (INDEPENDENT_AMBULATORY_CARE_PROVIDER_SITE_OTHER): Payer: BLUE CROSS/BLUE SHIELD | Admitting: Neurology

## 2016-05-30 ENCOUNTER — Encounter: Payer: Self-pay | Admitting: Neurology

## 2016-05-30 VITALS — BP 148/84 | HR 64 | Ht 67.0 in | Wt 211.0 lb

## 2016-05-30 DIAGNOSIS — R2689 Other abnormalities of gait and mobility: Secondary | ICD-10-CM | POA: Diagnosis not present

## 2016-05-30 DIAGNOSIS — R42 Dizziness and giddiness: Secondary | ICD-10-CM | POA: Diagnosis not present

## 2016-05-30 NOTE — Progress Notes (Signed)
PATIENT: David Harris DOB: 1965/07/20  Chief Complaint  Patient presents with  . Dizziness    He is here with his wife, Butch Penny.  Reports intermittent dizzy spells over the last two years.  Denies symptoms worsening with positional changes.  He sometimes goes weeks without an episode.  For the last two weeks, he has been taking OTC Dramamine 50mg  every morning and he has not had any events since starting this medication daily.     HISTORICAL  David Harris is a 50 years old left-handed male, seen in refer by  his primary care physician Dr.Lawrence Fusco for evaluation of dizziness, initial evaluation was May 30 2016  I reviewed and summarized the referral note, he had a past medical history of hypertension, diabetes since 2015, chronic kidney disease, with mild elevated creatinine 1.2,  Since 2015, he began to notice intermittent dizziness, he described it as dizziness episode, as if he is going to lose balance, he has to stop in the middle, hold on to recover from the on balance, it last few second, he denies vertigo, denies hearing loss, no visual loss, it only happened in the standing position  He denies bilateral lower extremity paresthesia or weakness.  Over the past few months, he noticed increased occurrence, it only happened in standing position, does not happened in the sitting down or lying down position, he worked at [employer], sometimes it happened during his work when he is bending over  He has been taking meclizine, dramamine as needed, which has been helpful,  REVIEW OF SYSTEMS: Full 14 system review of systems performed and notable only for dizziness, passing out  ALLERGIES: No Known Allergies  HOME MEDICATIONS: Current Outpatient Prescriptions  Medication Sig Dispense Refill  . amLODipine (NORVASC) 10 MG tablet Take 10 mg by mouth daily.    Marland Kitchen dimenhyDRINATE (DRAMAMINE) 50 MG tablet Take 50 mg by mouth daily.    Marland Kitchen GLIPIZIDE XL 2.5 MG 24 hr  tablet Take 2.5 mg by mouth as needed.   11  . losartan (COZAAR) 100 MG tablet Take 100 mg by mouth daily.    . meloxicam (MOBIC) 7.5 MG tablet as needed.  2  . metoprolol succinate (TOPROL XL) 50 MG 24 hr tablet Take 50 mg by mouth daily.     No current facility-administered medications for this visit.     PAST MEDICAL HISTORY: Past Medical History:  Diagnosis Date  . Diabetes mellitus without complication ([place])   . Hypertension   . Rectal bleeding   . Vertigo     PAST SURGICAL HISTORY: Past Surgical History:  Procedure Laterality Date  . CIRCUMCISION    . COLONOSCOPY N/A 06/13/2015   Procedure: COLONOSCOPY;  Surgeon: Rogene Houston, MD;  Location: [facility];  Service: Endoscopy;  Laterality: N/A;  7:30    FAMILY HISTORY: Family History  Problem Relation Age of Onset  . Hypertension Mother   . Stroke Mother   . Hypertension Father   . Kidney disease Father   . Liver disease Father   . Diabetes Father   . Heart attack Father     SOCIAL HISTORY:  Social History   Social History  . Marital status: Married    Spouse name: N/A  . Number of children: 4  . Years of education: HS   Occupational History  . textile   Social History Main Topics  . Smoking status: Never Smoker  . Smokeless tobacco: Never Used  . Alcohol use  Yes     Comment: 3 times a week 3 beers  . Drug use: No  . Sexual activity: Yes   Other Topics Concern  . Not on file   Social History Narrative   Lives at home with his wife.   Left-handed.   No caffeine use.     PHYSICAL EXAM   Vitals:   05/30/16 0805  BP: (!) 148/84  Pulse: 64  Weight: 211 lb (95.7 kg)  Height: 5\' 7"  (1.702 m)    Not recorded      Body mass index is 33.05 kg/m.  PHYSICAL EXAMNIATION:  Gen: NAD, conversant, well nourised, obese, well groomed                     Cardiovascular: Regular rate rhythm, no peripheral edema, warm, nontender. Eyes: Conjunctivae clear without exudates or hemorrhage Neck:  Supple, no carotid bruits. Pulmonary: Clear to auscultation bilaterally   NEUROLOGICAL EXAM:  MENTAL STATUS: Speech:    Speech is normal; fluent and spontaneous with normal comprehension.  Cognition:     Orientation to time, place and person     Normal recent and remote memory     Normal Attention span and concentration     Normal Language, naming, repeating,spontaneous speech     Fund of knowledge   CRANIAL NERVES: CN II: Visual fields are full to confrontation. Fundoscopic exam is normal with sharp discs and no vascular changes. Pupils are round equal and briskly reactive to light. CN III, IV, VI: extraocular movement are normal. No ptosis. CN V: Facial sensation is intact to pinprick in all 3 divisions bilaterally. Corneal responses are intact.  CN VII: Face is symmetric with normal eye closure and smile. CN VIII: Hearing is normal to rubbing fingers CN IX, X: Palate elevates symmetrically. Phonation is normal. CN XI: Head turning and shoulder shrug are intact CN XII: Tongue is midline with normal movements and no atrophy.  MOTOR: There is no pronator drift of out-stretched arms. Muscle bulk and tone are normal. Muscle strength is normal.  REFLEXES: Reflexes are 2+ and symmetric at the biceps, triceps, knees, and ankles. Plantar responses are flexor.  SENSORY: Intact to light touch, pinprick, positional sensation and vibratory sensation are intact in fingers and toes.  COORDINATION: Rapid alternating movements and fine finger movements are intact. There is no dysmetria on finger-to-nose and heel-knee-shin.    GAIT/STANCE: Posture is normal. Gait is steady with normal steps, base, arm swing, and turning. Heel and toe walking are normal. Tandem gait is normal.  Romberg is absent.   DIAGNOSTIC DATA (LABS, IMAGING, TESTING) - I reviewed patient records, labs, notes, testing and imaging myself where available.   ASSESSMENT AND PLAN  David Harris is a 50 y.o. male     Intermittent dizziness, unbalanced sensation  Need to rule out brainstem/cerebellum pathology  Proceed with MRI of the brain without contrast  Differentiation diagnosis also including orthostatic blood pressure changes, I have advised him checking his blood pressure and document it daily, keep well hydration   Marcial Pacas, M.D. Ph.D.  [facility] [address] [place], [place] [address] Ph: [phone] Fax: [phone]  CC: Redmond School, MD

## 2016-06-06 ENCOUNTER — Telehealth: Payer: Self-pay | Admitting: Neurology

## 2016-06-06 NOTE — Telephone Encounter (Signed)
Pt returned Emily's call. Pls call at [phone]

## 2016-06-07 NOTE — Telephone Encounter (Signed)
I called the patient back and left a voicemail for him to call me

## 2016-06-07 NOTE — Telephone Encounter (Signed)
I called the patient on a different number and spoke to him and informed him I will schedule the MRI at [facility] because he needs to be sedated. I left a message w/Alisha at [facility] to call me back to schedule this MRI.

## 2016-07-11 ENCOUNTER — Inpatient Hospital Stay (HOSPITAL_COMMUNITY)
Admission: RE | Admit: 2016-07-11 | Discharge: 2016-07-11 | Disposition: A | Discharge status: Home / Self Care | Payer: BLUE CROSS/BLUE SHIELD | Source: Ambulatory Visit

## 2016-07-11 ENCOUNTER — Other Ambulatory Visit (HOSPITAL_COMMUNITY): Payer: Self-pay | Admitting: *Deleted

## 2016-07-11 ENCOUNTER — Encounter (HOSPITAL_COMMUNITY): Payer: Self-pay

## 2016-07-11 HISTORY — DX: Persons encountering health services in other specified circumstances: Z76.89

## 2016-07-11 HISTORY — DX: Unspecified osteoarthritis, unspecified site: M19.90

## 2016-07-11 NOTE — Progress Notes (Signed)
Pt. Reports that the cardiac studies ( requested via fax from Dr. Hamilton Capri) were completed in 2016 & the pt. Was told that the results were wnl.

## 2016-07-11 NOTE — Pre-Procedure Instructions (Addendum)
    David Harris  07/11/2016     Your procedure is scheduled on Tuesday, July 17, 2016 at 10:00 AM.   Report to [facility] Entrance "A" Admitting Office at 8:00 AM.   Call this number if you have problems the morning of surgery: [phone]   Questions prior to day of surgery, please call [phone] between 8 & 4 PM.   Remember:  Do not eat food or drink liquids after midnight Monday, 07/16/16.  Take these medicines the morning of surgery with A SIP OF WATER: Amlodipine (Norvasc), Metoprolol (Toprol XL)   How to Manage Your Diabetes Before Surgery   Why is it important to control my blood sugar before and after surgery?   Improving blood sugar levels before and after surgery helps healing and can limit problems.  A way of improving blood sugar control is eating a healthy diet by:  - Eating less sugar and carbohydrates  - Increasing activity/exercise  - Talk with your doctor about reaching your blood sugar goals  High blood sugars (greater than 180 mg/dL) can raise your risk of infections and slow down your recovery so you will need to focus on controlling your diabetes during the weeks before surgery.  Make sure that the doctor who takes care of your diabetes knows about your planned surgery including the date and location.  How do I manage my blood sugars before surgery?   Check your blood sugar at least 4 times a day, 2 days before surgery to make sure that they are not too high or low.  Check your blood sugar the morning of your surgery when you wake up and every 2 hours until you get to the Short-Stay unit.  Treat a low blood sugar (less than 70 mg/dL) with 1/2 cup of clear juice (cranberry or apple), 4 glucose tablets, OR glucose gel.  Recheck blood sugar in 15 minutes after treatment (to make sure it is greater than 70 mg/dL).  If blood sugar is not greater than 70 mg/dL on re-check, call [phone] for further instructions.   Report your blood  sugar to the Short-Stay nurse when you get to Short-Stay.  References:  [facility], 2007 "How to Manage your Diabetes Before and After Surgery".  What do I do about my diabetes medications?   Do not take oral diabetes medicines (pills) the morning of surgery.   Do not wear jewelry.  Do not wear lotions, powders, or cologne.  Men may shave face and neck.  Do not bring valuables to the hospital.  [facility] is not responsible for any belongings or valuables.  Contacts, dentures or bridgework may not be worn into surgery.  Leave your suitcase in the car.  After surgery it may be brought to your room.  For patients admitted to the hospital, discharge time will be determined by your treatment team.  Patients discharged the day of surgery will not be allowed to drive home.   Special instructions:  See "Preparing for Surgery" Instruction sheet.  Please read over the fact sheets that you were given.

## 2016-07-16 ENCOUNTER — Telehealth: Payer: Self-pay | Admitting: Neurology

## 2016-07-16 NOTE — Telephone Encounter (Signed)
Spoke to patient - he would like to try the open scanner again, at [facility], with oral sedation.  I have returned the call to OR and spoken to the charge nurse, Heath Lark [phone] - he will cancel him off the schedule.  States MRI still needs to be notified of the cancellation.  The patient is aware to expect a call from Raquel Sarna to give him a new appt date at [facility].  A prescription for oral sedation will be sent to his local pharmacy.

## 2016-07-16 NOTE — Telephone Encounter (Signed)
I called Sonia Baller back and she informed me that it needs to be an update on his [place]. I asked her if the last office notes was good enough  she said no it was pass the 30 days. She said a physician needs to go into his chart and state something "no changes have been made in the last 30 days" on his last [place].

## 2016-07-16 NOTE — Telephone Encounter (Signed)
David Harris/[facility] [phone] called said pt is having MRI tomorrow, she needs HMP for last 30 days. Please call

## 2016-07-16 NOTE — Telephone Encounter (Signed)
Spoke to patient's wife - says he was unable to complete his MRI at [facility] in an open scanner.  He did not try it with oral sedation.  He is now scheduled at [facility] to have his MRI under anesthesia.  They contacted [place] today for verbal clearance, since he has not been in more than 30 days.  We have called the patient and provided him with two options: 1) Try the open scanner again at [facility] w/ oral sedation 2) See his PCP today to be cleared to undergo anesthesia (Dr. Krista Blue is not in the office this week). His wife will get the message to the patient and have him call us back.    Please interrupt me for his return call.

## 2016-07-16 NOTE — Progress Notes (Signed)
Spoke with Lovey Newcomer at [facility] about needing an updated H+P for MRI scheduled tomorrow.   [id number]  Re- requested stress test, EKG from Dr. Dion Body office. Jacqlyn Larsen will fax.  [phone]

## 2016-07-17 ENCOUNTER — Other Ambulatory Visit: Payer: Self-pay | Admitting: *Deleted

## 2016-07-17 ENCOUNTER — Ambulatory Visit: Admit: 2016-07-17 | Payer: BLUE CROSS/BLUE SHIELD

## 2016-07-17 ENCOUNTER — Ambulatory Visit (HOSPITAL_COMMUNITY): Admission: RE | Admit: 2016-07-17 | Payer: BLUE CROSS/BLUE SHIELD | Source: Ambulatory Visit

## 2016-07-17 SURGERY — RADIOLOGY WITH ANESTHESIA
Anesthesia: General

## 2016-07-17 MED ORDER — ALPRAZOLAM 1 MG PO TABS: ORAL_TABLET | ORAL | 0 refills | Status: DC

## 2016-07-17 NOTE — Telephone Encounter (Signed)
I faxed the order to [facility] I also informed Larene Beach I at [facility] once she receive's the fax to please call the patient to schedule.

## 2016-07-19 ENCOUNTER — Ambulatory Visit (INDEPENDENT_AMBULATORY_CARE_PROVIDER_SITE_OTHER): Payer: Self-pay

## 2016-07-19 DIAGNOSIS — R42 Dizziness and giddiness: Secondary | ICD-10-CM | POA: Diagnosis not present

## 2016-07-19 DIAGNOSIS — R2689 Other abnormalities of gait and mobility: Secondary | ICD-10-CM | POA: Diagnosis not present

## 2016-07-19 DIAGNOSIS — Z0289 Encounter for other administrative examinations: Secondary | ICD-10-CM

## 2016-08-07 DIAGNOSIS — E1129 Type 2 diabetes mellitus with other diabetic kidney complication: Secondary | ICD-10-CM | POA: Diagnosis not present

## 2016-08-07 DIAGNOSIS — Z1389 Encounter for screening for other disorder: Secondary | ICD-10-CM | POA: Diagnosis not present

## 2016-08-07 DIAGNOSIS — I10 Essential (primary) hypertension: Secondary | ICD-10-CM | POA: Diagnosis not present

## 2016-08-07 DIAGNOSIS — E782 Mixed hyperlipidemia: Secondary | ICD-10-CM | POA: Diagnosis not present

## 2016-08-07 DIAGNOSIS — E6609 Other obesity due to excess calories: Secondary | ICD-10-CM | POA: Diagnosis not present

## 2016-08-07 DIAGNOSIS — Z6833 Body mass index (BMI) 33.0-33.9, adult: Secondary | ICD-10-CM | POA: Diagnosis not present

## 2016-08-07 DIAGNOSIS — E1165 Type 2 diabetes mellitus with hyperglycemia: Secondary | ICD-10-CM | POA: Diagnosis not present

## 2016-08-08 ENCOUNTER — Telehealth: Payer: Self-pay | Admitting: *Deleted

## 2016-08-08 NOTE — Telephone Encounter (Signed)
Pt MRI brain CD mailed to [facility] on 08/08/16.

## 2016-08-18 IMAGING — CT CT ABD-PELV W/O CM
2 of 4 series · 16 of 46 positions shown, 18 images · non-contrast
Comparison: None.

CLINICAL DATA: Acute onset of bright red blood per rectum. Initial
encounter.

EXAM:
CT ABDOMEN AND PELVIS WITHOUT CONTRAST
TECHNIQUE: Multidetector CT imaging of the abdomen and pelvis was performed
following the standard protocol without IV contrast.

[Series 2: abdomen/pelvis w/o contrast · axial · non-contrast · 0.66mm/px · z∈[+696,+1111]mm · 13 of 91 slices shown, 15 images]
[im 4/91  soft-tissue]
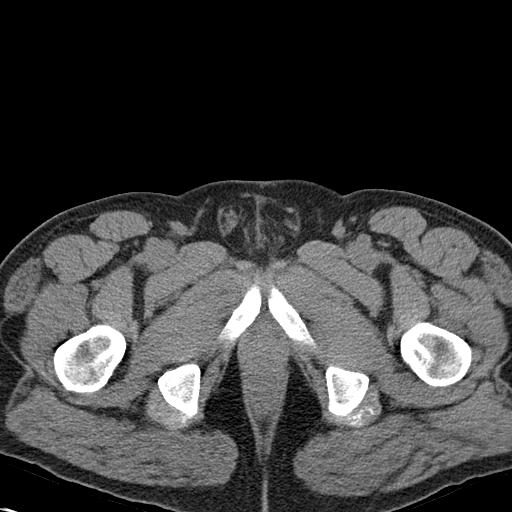
[im 4/91  bone]
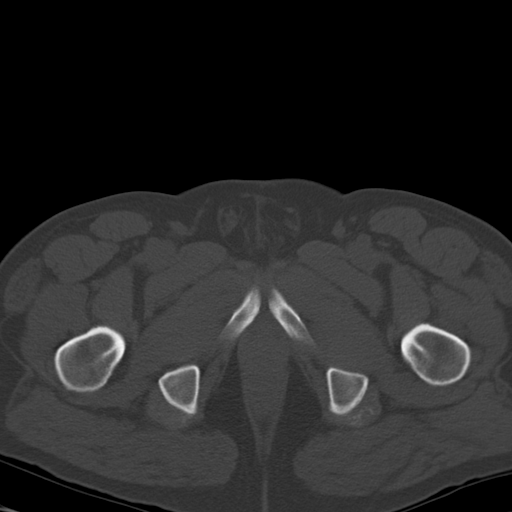
[im 12/91  soft-tissue]
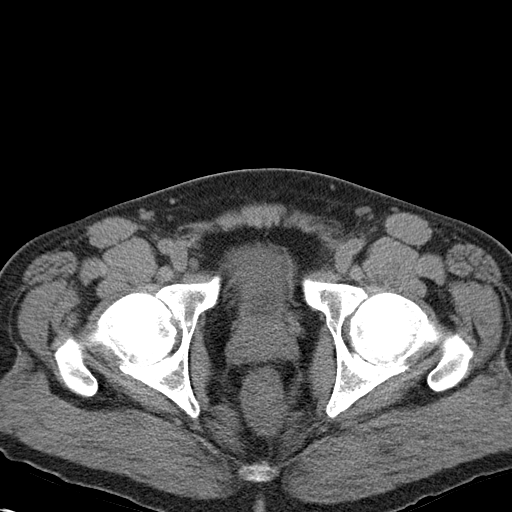
[im 20/91  soft-tissue]
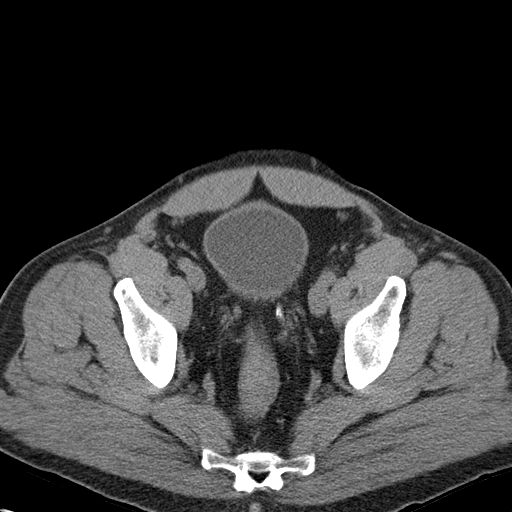
[im 24/91  soft-tissue]
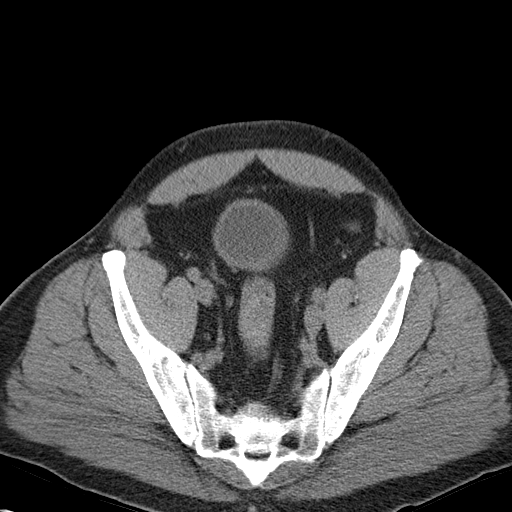
[im 32/91  soft-tissue]
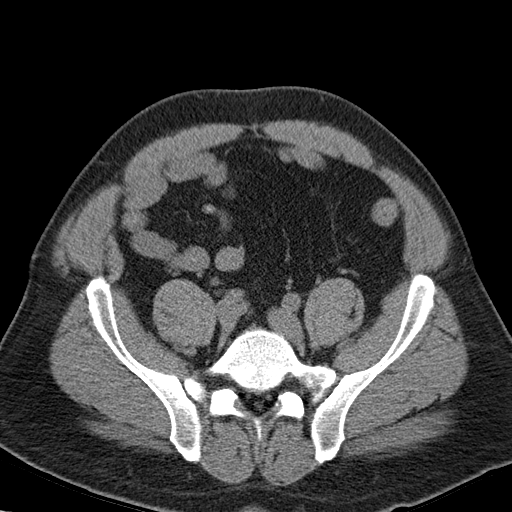
[im 40/91  soft-tissue]
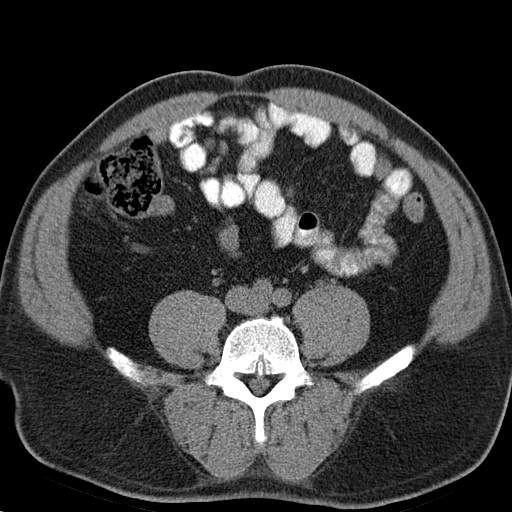
[im 47/91  soft-tissue]
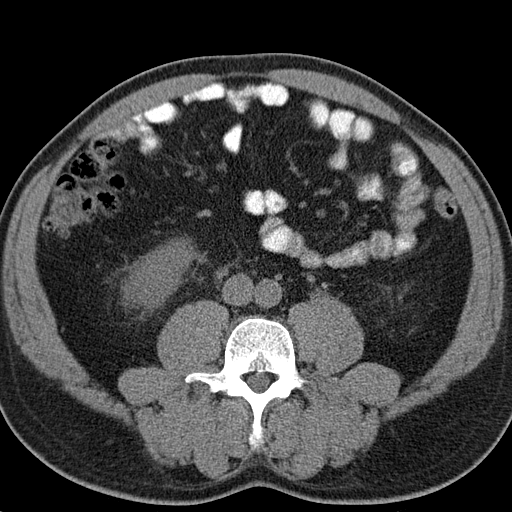
[im 51/91  soft-tissue]
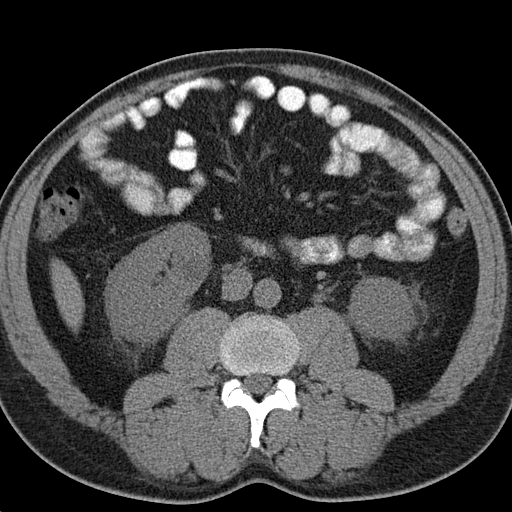
[im 59/91  soft-tissue]
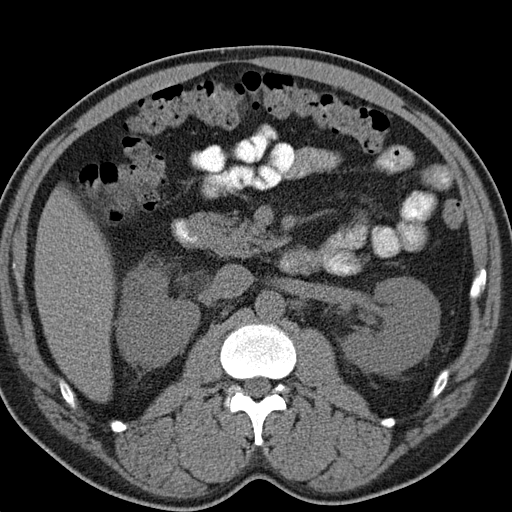
[im 59/91  bone]
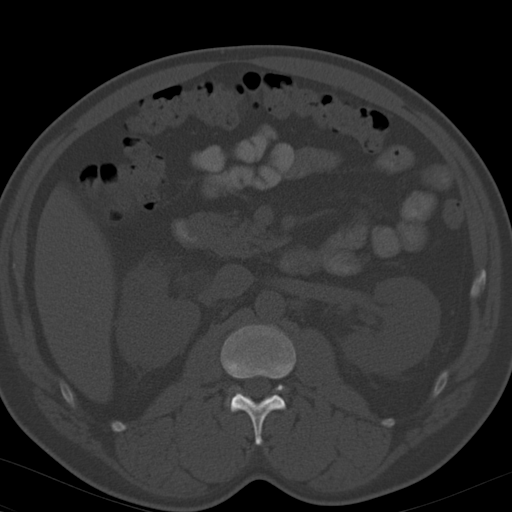
[im 67/91  soft-tissue]
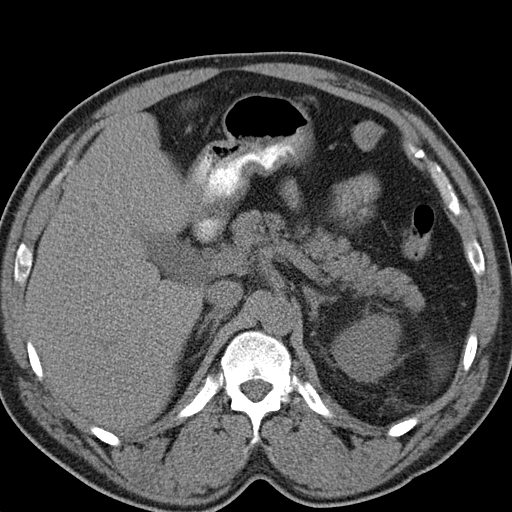
[im 71/91  soft-tissue]
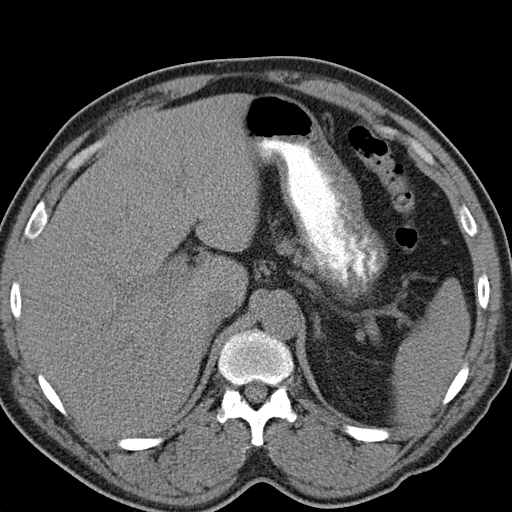
[im 79/91  soft-tissue]
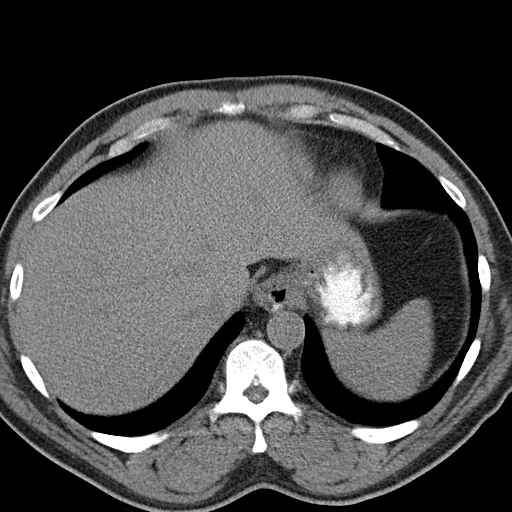
[im 87/91  soft-tissue]
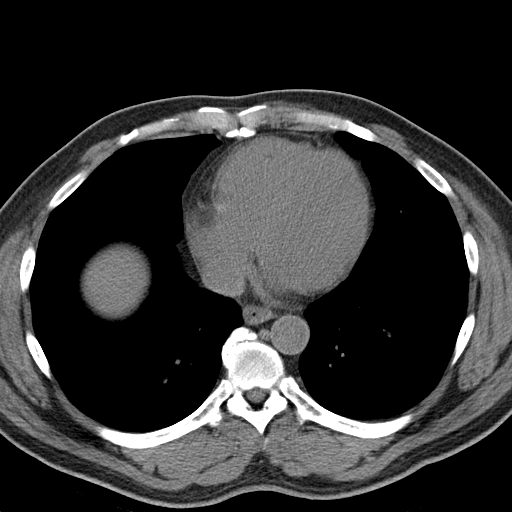

[Series 3: mpr cor 3.0mm · coronal · 0.75mm/px · 3 of 107 slices shown]
[im 36/107  soft-tissue]
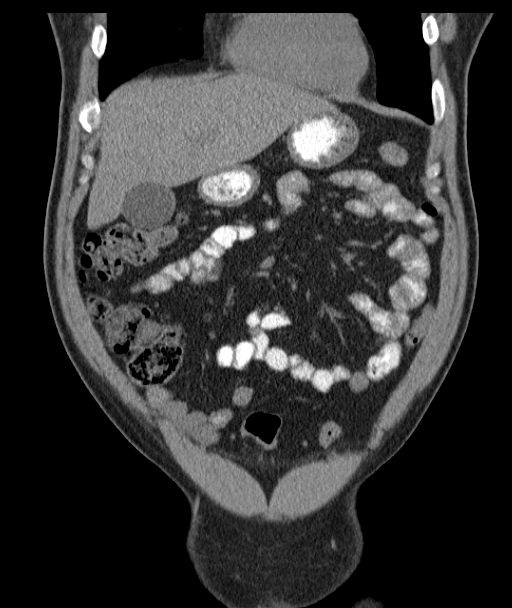
[im 48/107  soft-tissue]
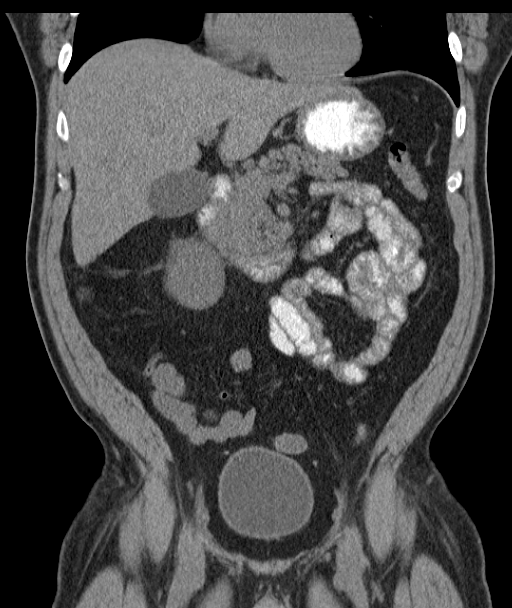
[im 59/107  soft-tissue]
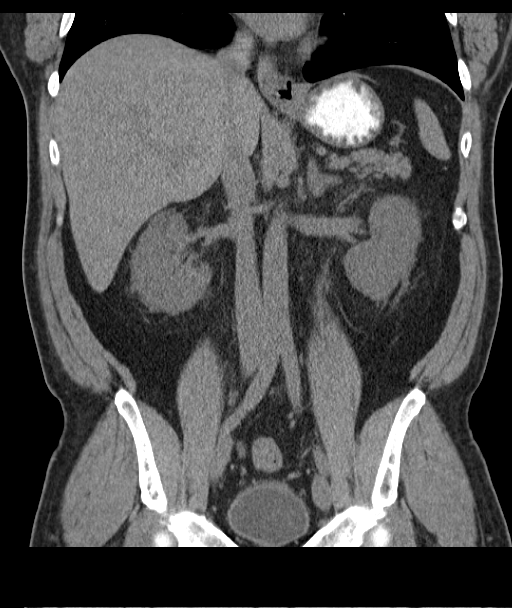

[16 of 46 positions shown; findings below may reference images not displayed]

FINDINGS: The visualized lung bases are clear.

The liver and spleen are unremarkable in appearance. The gallbladder
is within normal limits. The pancreas and adrenal glands are
unremarkable.

Nonspecific perinephric stranding is noted bilaterally. A 1.5 cm
cyst is noted at the medial aspect of the right kidney. There is no
evidence of hydronephrosis. No renal or ureteral stones are
identified.

No free fluid is identified. The small bowel is unremarkable in
appearance. The stomach is within normal limits. No acute vascular
abnormalities are seen.

The appendix is normal in caliber and contains air, without evidence
of appendicitis. Scattered diverticulosis is noted along the
ascending colon. The colon is otherwise unremarkable.

The bladder is mildly distended and grossly unremarkable. The
prostate remains normal in size. No inguinal lymphadenopathy is
seen.

No acute osseous abnormalities are identified.
IMPRESSION: 1. No acute abnormality seen within the abdomen or pelvis.
2. Nonspecific bilateral perinephric stranding noted. Would
correlate clinically for any evidence of pyelonephritis.
3. Scattered diverticulosis along the ascending colon.
4. Right renal cyst noted.

## 2016-08-21 DIAGNOSIS — I10 Essential (primary) hypertension: Secondary | ICD-10-CM | POA: Diagnosis not present

## 2016-08-21 DIAGNOSIS — Z6833 Body mass index (BMI) 33.0-33.9, adult: Secondary | ICD-10-CM | POA: Diagnosis not present

## 2016-08-21 DIAGNOSIS — E785 Hyperlipidemia, unspecified: Secondary | ICD-10-CM | POA: Diagnosis not present

## 2016-08-21 DIAGNOSIS — Z713 Dietary counseling and surveillance: Secondary | ICD-10-CM | POA: Diagnosis not present

## 2016-08-21 DIAGNOSIS — Z008 Encounter for other general examination: Secondary | ICD-10-CM | POA: Diagnosis not present

## 2016-08-21 DIAGNOSIS — E669 Obesity, unspecified: Secondary | ICD-10-CM | POA: Diagnosis not present

## 2016-08-21 DIAGNOSIS — E139 Other specified diabetes mellitus without complications: Secondary | ICD-10-CM | POA: Diagnosis not present

## 2016-08-28 ENCOUNTER — Ambulatory Visit (INDEPENDENT_AMBULATORY_CARE_PROVIDER_SITE_OTHER): Payer: BLUE CROSS/BLUE SHIELD | Admitting: Neurology

## 2016-08-28 ENCOUNTER — Encounter: Payer: Self-pay | Admitting: Neurology

## 2016-08-28 VITALS — BP 132/82 | HR 62 | Ht 67.0 in | Wt 213.0 lb

## 2016-08-28 DIAGNOSIS — R42 Dizziness and giddiness: Secondary | ICD-10-CM

## 2016-08-28 NOTE — Progress Notes (Signed)
PATIENT: David Harris DOB: [birth year]  Chief Complaint  Patient presents with  . Balance Problems    He is here with his wife, Butch Penny, to review his MRI results.  He has not had any further episodes since last seen.  He has stopped using Dramamine.     HISTORICAL  David Harris is a 51 years old left-handed male, seen in refer by  his primary care physician Dr.Lawrence Fusco for evaluation of dizziness, initial evaluation was May 30 2016  I reviewed and summarized the referral note, he had a past medical history of hypertension, diabetes since 2015, chronic kidney disease, with mild elevated creatinine 1.2,  Since 2015, he began to notice intermittent dizziness, he described it as dizziness episode, as if he is going to lose balance, he has to stop in the middle, hold on to recover from the unbalance, it last few second, he denies vertigo, denies hearing loss, no visual loss, it only happened in the standing position  He denies bilateral lower extremity paresthesia or weakness.  Over the past few months, he noticed increased occurrence, it only happened in standing position, does not happened in the sitting down or lying down position, he worked at [employer], sometimes it happened during his work when he is bending over  He has been taking meclizine, dramamine as needed, which has been helpful,  UPDATE August 28 2016: We have personally reviewed MRI of the brain in January 2018, there was no acute abnormality, He has stopped taking Dramamine, his dizziness actually improved, he is able to work full-time at [employer] without much difficulty, he still has transient neck discomfort dizziness with extreme prolonged neck extension.  REVIEW OF SYSTEMS: Full 14 system review of systems performed and notable only for as above   ALLERGIES: Allergies  Allergen Reactions  . No Known Allergies     HOME MEDICATIONS: Current Outpatient Prescriptions  Medication  Sig Dispense Refill  . ALPRAZolam (XANAX) 1 MG tablet Take 1-2 tablets thirty minutes prior to MRI.  May take one additional tablet before entering scanner, if needed.  MUST HAVE DRIVER. 3 tablet 0  . amLODipine (NORVASC) 10 MG tablet Take 10 mg by mouth daily with breakfast.     . dimenhyDRINATE (DRAMAMINE) 50 MG tablet Take 50 mg by mouth daily as needed (for vertigo).     Marland Kitchen GLIPIZIDE XL 2.5 MG 24 hr tablet Take 2.5 mg by mouth See admin instructions. 1 tablet once or twice a week  11  . losartan (COZAAR) 100 MG tablet Take 100 mg by mouth daily with breakfast.     . meloxicam (MOBIC) 7.5 MG tablet Take 7.5 mg by mouth daily as needed for pain.   2  . metoprolol succinate (TOPROL XL) 50 MG 24 hr tablet Take 50 mg by mouth daily with breakfast.     . naproxen sodium (ANAPROX) 220 MG tablet Take 440 mg by mouth 2 (two) times daily as needed (for pain.).     No current facility-administered medications for this visit.     PAST MEDICAL HISTORY: Past Medical History:  Diagnosis Date  . Arthritis    R knee  . Diabetes mellitus without complication ([place])   . Hypertension   . Rectal bleeding   . Sleep concern    Had sleep study sometime ago & told no need for CPAP  . Vertigo     PAST SURGICAL HISTORY: Past Surgical History:  Procedure Laterality Date  . CIRCUMCISION    .  COLONOSCOPY N/A 06/13/2015   Procedure: COLONOSCOPY;  Surgeon: Rogene Houston, MD;  [facility];  Service: Endoscopy;  Laterality: N/A;  7:30    FAMILY HISTORY: Family History  Problem Relation Age of Onset  . Hypertension Mother   . Stroke Mother   . Hypertension Father   . Kidney disease Father   . Liver disease Father   . Diabetes Father   . Heart attack Father     SOCIAL HISTORY:  Social History   Social History  . Marital status: Married    Spouse name: N/A  . Number of children: 4  . Years of education: HS   Occupational History  . textile   Social History Main Topics  .  Smoking status: Never Smoker  . Smokeless tobacco: Never Used  . Alcohol use Yes     Comment: 3 times a week 3 beers  . Drug use: No  . Sexual activity: Yes   Other Topics Concern  . Not on file   Social History Narrative   Lives at home with his wife.   Left-handed.   No caffeine use.     PHYSICAL EXAM   Vitals:   08/28/16 0907  BP: 132/82  Pulse: 62  Weight: 213 lb (96.6 kg)  Height: 5\' 7"  (1.702 m)    Not recorded      Body mass index is 33.36 kg/m.  PHYSICAL EXAMNIATION:  Gen: NAD, conversant, well nourised, obese, well groomed                     Cardiovascular: Regular rate rhythm, no peripheral edema, warm, nontender. Eyes: Conjunctivae clear without exudates or hemorrhage Neck: Supple, no carotid bruits. Pulmonary: Clear to auscultation bilaterally   NEUROLOGICAL EXAM:  MENTAL STATUS: Speech:    Speech is normal; fluent and spontaneous with normal comprehension.  Cognition:     Orientation to time, place and person     Normal recent and remote memory     Normal Attention span and concentration     Normal Language, naming, repeating,spontaneous speech     Fund of knowledge   CRANIAL NERVES: CN II: Visual fields are full to confrontation. Fundoscopic exam is normal with sharp discs and no vascular changes. Pupils are round equal and briskly reactive to light. CN III, IV, VI: extraocular movement are normal. No ptosis. CN V: Facial sensation is intact to pinprick in all 3 divisions bilaterally. Corneal responses are intact.  CN VII: Face is symmetric with normal eye closure and smile. CN VIII: Hearing is normal to rubbing fingers CN IX, X: Palate elevates symmetrically. Phonation is normal. CN XI: Head turning and shoulder shrug are intact CN XII: Tongue is midline with normal movements and no atrophy.  MOTOR: There is no pronator drift of out-stretched arms. Muscle bulk and tone are normal. Muscle strength is normal.  REFLEXES: Reflexes are 2+  and symmetric at the biceps, triceps, knees, and ankles. Plantar responses are flexor.  SENSORY: Intact to light touch, pinprick, positional sensation and vibratory sensation are intact in fingers and toes.  COORDINATION: Rapid alternating movements and fine finger movements are intact. There is no dysmetria on finger-to-nose and heel-knee-shin.    GAIT/STANCE: Posture is normal. Gait is steady with normal steps, base, arm swing, and turning. Heel and toe walking are normal. Tandem gait is normal.  Romberg is absent.   DIAGNOSTIC DATA (LABS, IMAGING, TESTING) - I reviewed patient records, labs, notes, testing and imaging myself  where available.   ASSESSMENT AND PLAN  David Harris is a 51 y.o. male   Intermittent dizziness, unbalanced sensation   MRI of the brain without contrast in January 2018 showed no significant abnormality  Differentiation diagnosis include orthostatic blood pressure changes, I have advised him checking his blood pressure and document it daily, keep well hydration     Marcial Pacas, M.D. Ph.D.  [facility] [address] [place], [place] [address] Ph: [phone] Fax: [phone]  CC: Redmond School, MD

## 2016-09-14 DIAGNOSIS — Z6833 Body mass index (BMI) 33.0-33.9, adult: Secondary | ICD-10-CM | POA: Diagnosis not present

## 2016-09-14 DIAGNOSIS — R945 Abnormal results of liver function studies: Secondary | ICD-10-CM | POA: Diagnosis not present

## 2016-09-14 DIAGNOSIS — Z1389 Encounter for screening for other disorder: Secondary | ICD-10-CM | POA: Diagnosis not present

## 2016-09-14 DIAGNOSIS — E1165 Type 2 diabetes mellitus with hyperglycemia: Secondary | ICD-10-CM | POA: Diagnosis not present

## 2016-09-14 DIAGNOSIS — E039 Hypothyroidism, unspecified: Secondary | ICD-10-CM | POA: Diagnosis not present

## 2016-10-12 DIAGNOSIS — M25569 Pain in unspecified knee: Secondary | ICD-10-CM | POA: Diagnosis not present

## 2016-10-12 DIAGNOSIS — Z1389 Encounter for screening for other disorder: Secondary | ICD-10-CM | POA: Diagnosis not present

## 2016-10-12 DIAGNOSIS — D485 Neoplasm of uncertain behavior of skin: Secondary | ICD-10-CM | POA: Diagnosis not present

## 2016-10-12 DIAGNOSIS — Z6841 Body Mass Index (BMI) 40.0 and over, adult: Secondary | ICD-10-CM | POA: Diagnosis not present

## 2016-10-12 DIAGNOSIS — D2122 Benign neoplasm of connective and other soft tissue of left lower limb, including hip: Secondary | ICD-10-CM | POA: Diagnosis not present

## 2016-10-12 DIAGNOSIS — M25562 Pain in left knee: Secondary | ICD-10-CM | POA: Diagnosis not present

## 2016-11-27 DIAGNOSIS — Z719 Counseling, unspecified: Secondary | ICD-10-CM | POA: Diagnosis not present

## 2016-11-27 DIAGNOSIS — Z6831 Body mass index (BMI) 31.0-31.9, adult: Secondary | ICD-10-CM | POA: Diagnosis not present

## 2016-11-27 DIAGNOSIS — Z008 Encounter for other general examination: Secondary | ICD-10-CM | POA: Diagnosis not present

## 2016-11-27 DIAGNOSIS — E669 Obesity, unspecified: Secondary | ICD-10-CM | POA: Diagnosis not present

## 2016-11-27 DIAGNOSIS — E785 Hyperlipidemia, unspecified: Secondary | ICD-10-CM | POA: Diagnosis not present

## 2016-11-27 DIAGNOSIS — I10 Essential (primary) hypertension: Secondary | ICD-10-CM | POA: Diagnosis not present

## 2016-11-27 DIAGNOSIS — E139 Other specified diabetes mellitus without complications: Secondary | ICD-10-CM | POA: Diagnosis not present

## 2017-02-19 DIAGNOSIS — E785 Hyperlipidemia, unspecified: Secondary | ICD-10-CM | POA: Diagnosis not present

## 2017-02-19 DIAGNOSIS — Z008 Encounter for other general examination: Secondary | ICD-10-CM | POA: Diagnosis not present

## 2017-02-19 DIAGNOSIS — I10 Essential (primary) hypertension: Secondary | ICD-10-CM | POA: Diagnosis not present

## 2017-02-19 DIAGNOSIS — E669 Obesity, unspecified: Secondary | ICD-10-CM | POA: Diagnosis not present

## 2017-02-19 DIAGNOSIS — Z719 Counseling, unspecified: Secondary | ICD-10-CM | POA: Diagnosis not present

## 2017-02-19 DIAGNOSIS — E139 Other specified diabetes mellitus without complications: Secondary | ICD-10-CM | POA: Diagnosis not present

## 2017-02-19 DIAGNOSIS — Z6831 Body mass index (BMI) 31.0-31.9, adult: Secondary | ICD-10-CM | POA: Diagnosis not present

## 2017-04-11 DIAGNOSIS — Z1389 Encounter for screening for other disorder: Secondary | ICD-10-CM | POA: Diagnosis not present

## 2017-04-11 DIAGNOSIS — E6609 Other obesity due to excess calories: Secondary | ICD-10-CM | POA: Diagnosis not present

## 2017-04-11 DIAGNOSIS — Z6832 Body mass index (BMI) 32.0-32.9, adult: Secondary | ICD-10-CM | POA: Diagnosis not present

## 2017-04-11 DIAGNOSIS — I10 Essential (primary) hypertension: Secondary | ICD-10-CM | POA: Diagnosis not present

## 2017-04-11 DIAGNOSIS — E119 Type 2 diabetes mellitus without complications: Secondary | ICD-10-CM | POA: Diagnosis not present

## 2017-05-14 DIAGNOSIS — I10 Essential (primary) hypertension: Secondary | ICD-10-CM | POA: Diagnosis not present

## 2017-05-14 DIAGNOSIS — E139 Other specified diabetes mellitus without complications: Secondary | ICD-10-CM | POA: Diagnosis not present

## 2017-05-14 DIAGNOSIS — Z6831 Body mass index (BMI) 31.0-31.9, adult: Secondary | ICD-10-CM | POA: Diagnosis not present

## 2017-05-14 DIAGNOSIS — E669 Obesity, unspecified: Secondary | ICD-10-CM | POA: Diagnosis not present

## 2017-05-14 DIAGNOSIS — E785 Hyperlipidemia, unspecified: Secondary | ICD-10-CM | POA: Diagnosis not present

## 2017-05-14 DIAGNOSIS — Z719 Counseling, unspecified: Secondary | ICD-10-CM | POA: Diagnosis not present

## 2017-05-14 DIAGNOSIS — Z008 Encounter for other general examination: Secondary | ICD-10-CM | POA: Diagnosis not present

## 2017-11-12 DIAGNOSIS — Z008 Encounter for other general examination: Secondary | ICD-10-CM | POA: Diagnosis not present

## 2017-11-12 DIAGNOSIS — I10 Essential (primary) hypertension: Secondary | ICD-10-CM | POA: Diagnosis not present

## 2017-11-12 DIAGNOSIS — E669 Obesity, unspecified: Secondary | ICD-10-CM | POA: Diagnosis not present

## 2017-11-12 DIAGNOSIS — Z719 Counseling, unspecified: Secondary | ICD-10-CM | POA: Diagnosis not present

## 2017-12-31 DIAGNOSIS — M79672 Pain in left foot: Secondary | ICD-10-CM | POA: Diagnosis not present

## 2017-12-31 DIAGNOSIS — M109 Gout, unspecified: Secondary | ICD-10-CM | POA: Diagnosis not present

## 2018-01-14 DIAGNOSIS — I10 Essential (primary) hypertension: Secondary | ICD-10-CM | POA: Diagnosis not present

## 2018-01-14 DIAGNOSIS — Z008 Encounter for other general examination: Secondary | ICD-10-CM | POA: Diagnosis not present

## 2018-01-14 DIAGNOSIS — E669 Obesity, unspecified: Secondary | ICD-10-CM | POA: Diagnosis not present

## 2018-01-14 DIAGNOSIS — Z719 Counseling, unspecified: Secondary | ICD-10-CM | POA: Diagnosis not present

## 2018-01-28 DIAGNOSIS — M76822 Posterior tibial tendinitis, left leg: Secondary | ICD-10-CM | POA: Diagnosis not present

## 2018-01-28 DIAGNOSIS — M25562 Pain in left knee: Secondary | ICD-10-CM | POA: Diagnosis not present

## 2018-01-28 DIAGNOSIS — M79672 Pain in left foot: Secondary | ICD-10-CM | POA: Diagnosis not present

## 2018-01-28 DIAGNOSIS — M11862 Other specified crystal arthropathies, left knee: Secondary | ICD-10-CM | POA: Diagnosis not present

## 2018-02-06 DIAGNOSIS — I10 Essential (primary) hypertension: Secondary | ICD-10-CM | POA: Diagnosis not present

## 2018-02-06 DIAGNOSIS — E6609 Other obesity due to excess calories: Secondary | ICD-10-CM | POA: Diagnosis not present

## 2018-02-06 DIAGNOSIS — Z1389 Encounter for screening for other disorder: Secondary | ICD-10-CM | POA: Diagnosis not present

## 2018-02-06 DIAGNOSIS — E119 Type 2 diabetes mellitus without complications: Secondary | ICD-10-CM | POA: Diagnosis not present

## 2018-02-06 DIAGNOSIS — Z6831 Body mass index (BMI) 31.0-31.9, adult: Secondary | ICD-10-CM | POA: Diagnosis not present

## 2018-02-06 DIAGNOSIS — Z0001 Encounter for general adult medical examination with abnormal findings: Secondary | ICD-10-CM | POA: Diagnosis not present

## 2018-02-06 DIAGNOSIS — E1129 Type 2 diabetes mellitus with other diabetic kidney complication: Secondary | ICD-10-CM | POA: Diagnosis not present

## 2018-02-10 DIAGNOSIS — Z0000 Encounter for general adult medical examination without abnormal findings: Secondary | ICD-10-CM | POA: Diagnosis not present

## 2018-02-10 DIAGNOSIS — E782 Mixed hyperlipidemia: Secondary | ICD-10-CM | POA: Diagnosis not present

## 2018-07-22 DIAGNOSIS — E785 Hyperlipidemia, unspecified: Secondary | ICD-10-CM | POA: Diagnosis not present

## 2018-07-22 DIAGNOSIS — I10 Essential (primary) hypertension: Secondary | ICD-10-CM | POA: Diagnosis not present

## 2018-07-22 DIAGNOSIS — E669 Obesity, unspecified: Secondary | ICD-10-CM | POA: Diagnosis not present

## 2018-07-22 DIAGNOSIS — Z008 Encounter for other general examination: Secondary | ICD-10-CM | POA: Diagnosis not present

## 2018-11-27 DIAGNOSIS — I10 Essential (primary) hypertension: Secondary | ICD-10-CM | POA: Diagnosis not present

## 2018-11-27 DIAGNOSIS — Z1389 Encounter for screening for other disorder: Secondary | ICD-10-CM | POA: Diagnosis not present

## 2018-11-27 DIAGNOSIS — E1165 Type 2 diabetes mellitus with hyperglycemia: Secondary | ICD-10-CM | POA: Diagnosis not present

## 2018-11-27 DIAGNOSIS — Z6831 Body mass index (BMI) 31.0-31.9, adult: Secondary | ICD-10-CM | POA: Diagnosis not present

## 2018-11-27 DIAGNOSIS — E7849 Other hyperlipidemia: Secondary | ICD-10-CM | POA: Diagnosis not present

## 2018-11-27 DIAGNOSIS — E6609 Other obesity due to excess calories: Secondary | ICD-10-CM | POA: Diagnosis not present

## 2018-11-28 DIAGNOSIS — I10 Essential (primary) hypertension: Secondary | ICD-10-CM | POA: Diagnosis not present

## 2018-11-28 DIAGNOSIS — N182 Chronic kidney disease, stage 2 (mild): Secondary | ICD-10-CM | POA: Diagnosis not present

## 2019-08-20 DIAGNOSIS — E7849 Other hyperlipidemia: Secondary | ICD-10-CM | POA: Diagnosis not present

## 2019-08-20 DIAGNOSIS — Z6831 Body mass index (BMI) 31.0-31.9, adult: Secondary | ICD-10-CM | POA: Diagnosis not present

## 2019-08-20 DIAGNOSIS — E6609 Other obesity due to excess calories: Secondary | ICD-10-CM | POA: Diagnosis not present

## 2019-08-20 DIAGNOSIS — I10 Essential (primary) hypertension: Secondary | ICD-10-CM | POA: Diagnosis not present

## 2019-08-20 DIAGNOSIS — R890 Abnormal level of enzymes in specimens from other organs, systems and tissues: Secondary | ICD-10-CM | POA: Diagnosis not present

## 2019-08-20 DIAGNOSIS — E1129 Type 2 diabetes mellitus with other diabetic kidney complication: Secondary | ICD-10-CM | POA: Diagnosis not present

## 2020-02-07 DIAGNOSIS — Z91030 Bee allergy status: Secondary | ICD-10-CM | POA: Diagnosis not present

## 2020-02-07 DIAGNOSIS — R21 Rash and other nonspecific skin eruption: Secondary | ICD-10-CM | POA: Diagnosis not present

## 2020-04-28 DIAGNOSIS — Z23 Encounter for immunization: Secondary | ICD-10-CM | POA: Diagnosis not present

## 2020-06-21 ENCOUNTER — Encounter (INDEPENDENT_AMBULATORY_CARE_PROVIDER_SITE_OTHER): Payer: Self-pay | Admitting: *Deleted

## 2020-11-18 DIAGNOSIS — E7849 Other hyperlipidemia: Secondary | ICD-10-CM | POA: Diagnosis not present

## 2020-11-18 DIAGNOSIS — I10 Essential (primary) hypertension: Secondary | ICD-10-CM | POA: Diagnosis not present

## 2020-11-18 DIAGNOSIS — Z1331 Encounter for screening for depression: Secondary | ICD-10-CM | POA: Diagnosis not present

## 2020-11-18 DIAGNOSIS — E1129 Type 2 diabetes mellitus with other diabetic kidney complication: Secondary | ICD-10-CM | POA: Diagnosis not present

## 2020-11-18 DIAGNOSIS — E039 Hypothyroidism, unspecified: Secondary | ICD-10-CM | POA: Diagnosis not present

## 2020-11-18 DIAGNOSIS — N182 Chronic kidney disease, stage 2 (mild): Secondary | ICD-10-CM | POA: Diagnosis not present

## 2020-11-18 DIAGNOSIS — Z0001 Encounter for general adult medical examination with abnormal findings: Secondary | ICD-10-CM | POA: Diagnosis not present

## 2020-11-18 DIAGNOSIS — M109 Gout, unspecified: Secondary | ICD-10-CM | POA: Diagnosis not present

## 2020-11-18 DIAGNOSIS — Z6830 Body mass index (BMI) 30.0-30.9, adult: Secondary | ICD-10-CM | POA: Diagnosis not present

## 2020-11-18 DIAGNOSIS — R748 Abnormal levels of other serum enzymes: Secondary | ICD-10-CM | POA: Diagnosis not present

## 2020-11-18 DIAGNOSIS — E6609 Other obesity due to excess calories: Secondary | ICD-10-CM | POA: Diagnosis not present

## 2020-11-18 DIAGNOSIS — Z1389 Encounter for screening for other disorder: Secondary | ICD-10-CM | POA: Diagnosis not present

## 2020-11-30 ENCOUNTER — Other Ambulatory Visit (HOSPITAL_COMMUNITY): Payer: Self-pay | Admitting: Family Medicine

## 2020-11-30 DIAGNOSIS — R748 Abnormal levels of other serum enzymes: Secondary | ICD-10-CM

## 2020-12-13 DIAGNOSIS — N281 Cyst of kidney, acquired: Secondary | ICD-10-CM | POA: Diagnosis not present

## 2020-12-13 DIAGNOSIS — K760 Fatty (change of) liver, not elsewhere classified: Secondary | ICD-10-CM | POA: Diagnosis not present

## 2020-12-13 DIAGNOSIS — R748 Abnormal levels of other serum enzymes: Secondary | ICD-10-CM | POA: Diagnosis not present

## 2020-12-13 DIAGNOSIS — R932 Abnormal findings on diagnostic imaging of liver and biliary tract: Secondary | ICD-10-CM | POA: Diagnosis not present

## 2022-01-05 DIAGNOSIS — E6609 Other obesity due to excess calories: Secondary | ICD-10-CM | POA: Diagnosis not present

## 2022-01-05 DIAGNOSIS — Z0001 Encounter for general adult medical examination with abnormal findings: Secondary | ICD-10-CM | POA: Diagnosis not present

## 2022-01-05 DIAGNOSIS — E039 Hypothyroidism, unspecified: Secondary | ICD-10-CM | POA: Diagnosis not present

## 2022-01-05 DIAGNOSIS — E782 Mixed hyperlipidemia: Secondary | ICD-10-CM | POA: Diagnosis not present

## 2022-01-05 DIAGNOSIS — Z6831 Body mass index (BMI) 31.0-31.9, adult: Secondary | ICD-10-CM | POA: Diagnosis not present

## 2022-01-05 DIAGNOSIS — E1129 Type 2 diabetes mellitus with other diabetic kidney complication: Secondary | ICD-10-CM | POA: Diagnosis not present

## 2022-01-05 DIAGNOSIS — I10 Essential (primary) hypertension: Secondary | ICD-10-CM | POA: Diagnosis not present

## 2022-01-05 DIAGNOSIS — Z1331 Encounter for screening for depression: Secondary | ICD-10-CM | POA: Diagnosis not present

## 2022-01-05 DIAGNOSIS — M1A0720 Idiopathic chronic gout, left ankle and foot, without tophus (tophi): Secondary | ICD-10-CM | POA: Diagnosis not present

## 2022-01-08 DIAGNOSIS — E1129 Type 2 diabetes mellitus with other diabetic kidney complication: Secondary | ICD-10-CM | POA: Diagnosis not present

## 2022-01-08 DIAGNOSIS — E039 Hypothyroidism, unspecified: Secondary | ICD-10-CM | POA: Diagnosis not present

## 2022-01-08 DIAGNOSIS — Z0001 Encounter for general adult medical examination with abnormal findings: Secondary | ICD-10-CM | POA: Diagnosis not present

## 2022-05-02 DIAGNOSIS — E6609 Other obesity due to excess calories: Secondary | ICD-10-CM | POA: Diagnosis not present

## 2022-05-02 DIAGNOSIS — S91332A Puncture wound without foreign body, left foot, initial encounter: Secondary | ICD-10-CM | POA: Diagnosis not present

## 2022-05-02 DIAGNOSIS — Z6831 Body mass index (BMI) 31.0-31.9, adult: Secondary | ICD-10-CM | POA: Diagnosis not present

## 2022-05-02 DIAGNOSIS — Z23 Encounter for immunization: Secondary | ICD-10-CM | POA: Diagnosis not present

## 2023-02-12 DIAGNOSIS — E1129 Type 2 diabetes mellitus with other diabetic kidney complication: Secondary | ICD-10-CM | POA: Diagnosis not present

## 2023-02-12 DIAGNOSIS — E6609 Other obesity due to excess calories: Secondary | ICD-10-CM | POA: Diagnosis not present

## 2023-02-12 DIAGNOSIS — M542 Cervicalgia: Secondary | ICD-10-CM | POA: Diagnosis not present

## 2023-02-12 DIAGNOSIS — Z6831 Body mass index (BMI) 31.0-31.9, adult: Secondary | ICD-10-CM | POA: Diagnosis not present

## 2023-07-18 DIAGNOSIS — Z6830 Body mass index (BMI) 30.0-30.9, adult: Secondary | ICD-10-CM | POA: Diagnosis not present

## 2023-07-18 DIAGNOSIS — Z0001 Encounter for general adult medical examination with abnormal findings: Secondary | ICD-10-CM | POA: Diagnosis not present

## 2023-07-18 DIAGNOSIS — Z1331 Encounter for screening for depression: Secondary | ICD-10-CM | POA: Diagnosis not present

## 2023-07-18 DIAGNOSIS — E1129 Type 2 diabetes mellitus with other diabetic kidney complication: Secondary | ICD-10-CM | POA: Diagnosis not present

## 2023-07-18 DIAGNOSIS — E6609 Other obesity due to excess calories: Secondary | ICD-10-CM | POA: Diagnosis not present

## 2023-10-25 DIAGNOSIS — Z6828 Body mass index (BMI) 28.0-28.9, adult: Secondary | ICD-10-CM | POA: Diagnosis not present

## 2023-10-25 DIAGNOSIS — E782 Mixed hyperlipidemia: Secondary | ICD-10-CM | POA: Diagnosis not present

## 2023-10-25 DIAGNOSIS — E1129 Type 2 diabetes mellitus with other diabetic kidney complication: Secondary | ICD-10-CM | POA: Diagnosis not present

## 2023-10-25 DIAGNOSIS — I10 Essential (primary) hypertension: Secondary | ICD-10-CM | POA: Diagnosis not present

## 2023-10-25 DIAGNOSIS — E039 Hypothyroidism, unspecified: Secondary | ICD-10-CM | POA: Diagnosis not present

## 2023-10-25 DIAGNOSIS — E663 Overweight: Secondary | ICD-10-CM | POA: Diagnosis not present

## 2023-10-25 DIAGNOSIS — R634 Abnormal weight loss: Secondary | ICD-10-CM | POA: Diagnosis not present

## 2023-11-14 ENCOUNTER — Other Ambulatory Visit (HOSPITAL_COMMUNITY): Payer: Self-pay | Admitting: Family Medicine

## 2023-11-14 DIAGNOSIS — R748 Abnormal levels of other serum enzymes: Secondary | ICD-10-CM

## 2023-11-25 ENCOUNTER — Other Ambulatory Visit: Payer: Self-pay | Admitting: *Deleted

## 2023-11-25 DIAGNOSIS — Z1211 Encounter for screening for malignant neoplasm of colon: Secondary | ICD-10-CM

## 2023-11-26 ENCOUNTER — Ambulatory Visit: Admitting: General Surgery

## 2023-11-26 ENCOUNTER — Encounter: Payer: Self-pay | Admitting: General Surgery

## 2023-11-26 VITALS — BP 133/83 | HR 75 | Temp 97.5°F | Resp 16 | Ht 67.0 in | Wt 186.0 lb

## 2023-11-26 DIAGNOSIS — Z1211 Encounter for screening for malignant neoplasm of colon: Secondary | ICD-10-CM

## 2023-11-26 MED ORDER — SUTAB 1479-225-188 MG PO TABS: 24.0000 | ORAL_TABLET | Freq: Once | ORAL | 0 refills | Status: AC

## 2023-11-26 NOTE — H&P (Signed)
 David Harris; [id number]; 1966-01-03   HPI Patient is a 58 year old black male who was referred to my care by Dr. Lewayne Records for a screening colonoscopy.  Patient has had a remote colonoscopy.  Benign polyps were removed.  He denies any blood per rectum, abnormal diarrhea or constipation.  He has had some weight loss but that is due to his diabetes. Past Medical History:  Diagnosis Date   Arthritis    R knee   Diabetes mellitus without complication ([facility])    Hypertension    Rectal bleeding    Sleep concern    Had sleep study sometime ago & told no need for CPAP   Vertigo     Past Surgical History:  Procedure Laterality Date   CIRCUMCISION     COLONOSCOPY N/A 06/13/2015   Procedure: COLONOSCOPY;  Surgeon: Ruby Corporal, MD;  [facility];  Service: Endoscopy;  Laterality: N/A;  7:30    Family History  Problem Relation Age of Onset   Hypertension Mother    Stroke Mother    Hypertension Father    Kidney disease Father    Liver disease Father    Diabetes Father    Heart attack Father     Current Outpatient Medications on File Prior to Visit  Medication Sig Dispense Refill   ALPRAZolam  (XANAX ) 1 MG tablet Take 1-2 tablets thirty minutes prior to MRI.  May take one additional tablet before entering scanner, if needed.  MUST HAVE DRIVER. 3 tablet 0   amLODipine (NORVASC) 10 MG tablet Take 10 mg by mouth daily with breakfast.      aspirin EC 81 MG tablet Take 81 mg by mouth daily.     JARDIANCE 25 MG TABS tablet Take 25 mg by mouth daily.     losartan (COZAAR) 100 MG tablet Take 100 mg by mouth daily with breakfast.      metFORMIN (GLUCOPHAGE-XR) 500 MG 24 hr tablet Take 500 mg by mouth 2 (two) times daily.     metoprolol succinate (TOPROL XL) 50 MG 24 hr tablet Take 50 mg by mouth daily with breakfast.      No current facility-administered medications on file prior to visit.    Allergies  Allergen Reactions   No Known Allergies     Social History   Substance  and Sexual Activity  Alcohol Use Yes   Comment: 3 times a week 3 beers    Social History   Tobacco Use  Smoking Status Never  Smokeless Tobacco Never    Review of Systems  Constitutional: Negative.   HENT: Negative.    Eyes: Negative.   Respiratory: Negative.    Cardiovascular: Negative.   Gastrointestinal: Negative.   Genitourinary: Negative.   Musculoskeletal: Negative.   Skin: Negative.   Neurological: Negative.   Endo/Heme/Allergies: Negative.   Psychiatric/Behavioral: Negative.      Objective   Vitals:   11/26/23 1458  BP: 133/83  Pulse: 75  Resp: 16  Temp: (!) 97.5 F (36.4 C)  SpO2: 97%    Physical Exam Vitals reviewed.  Constitutional:      Appearance: Normal appearance. He is normal weight. He is not ill-appearing.  HENT:     Head: Normocephalic and atraumatic.  Cardiovascular:     Rate and Rhythm: Normal rate and regular rhythm.     Heart sounds: Normal heart sounds. No murmur heard.    No friction rub. No gallop.  Pulmonary:     Effort: Pulmonary effort is normal. No  respiratory distress.     Breath sounds: Normal breath sounds. No stridor. No wheezing, rhonchi or rales.  Abdominal:     General: Bowel sounds are normal. There is no distension.     Palpations: Abdomen is soft. There is no mass.     Tenderness: There is no abdominal tenderness. There is no guarding or rebound.     Hernia: No hernia is present.  Skin:    General: Skin is warm and dry.  Neurological:     Mental Status: He is alert and oriented to person, place, and time.   Primary care notes reviewed  Assessment  Need for screening colonoscopy Plan  Patient is scheduled for screening colonoscopy on 12/17/2023.  The risks and benefits of the procedure including bleeding and perforation were fully explained to the patient, who gave informed consent.  Sutabs have been prescribed for bowel preparation.

## 2023-11-26 NOTE — Progress Notes (Signed)
 David Harris; [id number]; [birth year]   HPI Patient is a 58 year old black male who was referred to my care by Dr. Lewayne Records for a screening colonoscopy.  Patient has had a remote colonoscopy.  Benign polyps were removed.  He denies any blood per rectum, abnormal diarrhea or constipation.  He has had some weight loss but that is due to his diabetes. Past Medical History:  Diagnosis Date   Arthritis    R knee   Diabetes mellitus without complication ([facility])    Hypertension    Rectal bleeding    Sleep concern    Had sleep study sometime ago & told no need for CPAP   Vertigo     Past Surgical History:  Procedure Laterality Date   CIRCUMCISION     COLONOSCOPY N/A 06/13/2015   Procedure: COLONOSCOPY;  Surgeon: Ruby Corporal, MD;  [facility];  Service: Endoscopy;  Laterality: N/A;  7:30    Family History  Problem Relation Age of Onset   Hypertension Mother    Stroke Mother    Hypertension Father    Kidney disease Father    Liver disease Father    Diabetes Father    Heart attack Father     Current Outpatient Medications on File Prior to Visit  Medication Sig Dispense Refill   ALPRAZolam  (XANAX ) 1 MG tablet Take 1-2 tablets thirty minutes prior to MRI.  May take one additional tablet before entering scanner, if needed.  MUST HAVE DRIVER. 3 tablet 0   amLODipine (NORVASC) 10 MG tablet Take 10 mg by mouth daily with breakfast.      aspirin EC 81 MG tablet Take 81 mg by mouth daily.     JARDIANCE 25 MG TABS tablet Take 25 mg by mouth daily.     losartan (COZAAR) 100 MG tablet Take 100 mg by mouth daily with breakfast.      metFORMIN (GLUCOPHAGE-XR) 500 MG 24 hr tablet Take 500 mg by mouth 2 (two) times daily.     metoprolol succinate (TOPROL XL) 50 MG 24 hr tablet Take 50 mg by mouth daily with breakfast.      No current facility-administered medications on file prior to visit.    Allergies  Allergen Reactions   No Known Allergies     Social History   Substance  and Sexual Activity  Alcohol Use Yes   Comment: 3 times a week 3 beers    Social History   Tobacco Use  Smoking Status Never  Smokeless Tobacco Never    Review of Systems  Constitutional: Negative.   HENT: Negative.    Eyes: Negative.   Respiratory: Negative.    Cardiovascular: Negative.   Gastrointestinal: Negative.   Genitourinary: Negative.   Musculoskeletal: Negative.   Skin: Negative.   Neurological: Negative.   Endo/Heme/Allergies: Negative.   Psychiatric/Behavioral: Negative.      Objective   Vitals:   11/26/23 1458  BP: 133/83  Pulse: 75  Resp: 16  Temp: (!) 97.5 F (36.4 C)  SpO2: 97%    Physical Exam Vitals reviewed.  Constitutional:      Appearance: Normal appearance. He is normal weight. He is not ill-appearing.  HENT:     Head: Normocephalic and atraumatic.  Cardiovascular:     Rate and Rhythm: Normal rate and regular rhythm.     Heart sounds: Normal heart sounds. No murmur heard.    No friction rub. No gallop.  Pulmonary:     Effort: Pulmonary effort is normal. No  respiratory distress.     Breath sounds: Normal breath sounds. No stridor. No wheezing, rhonchi or rales.  Abdominal:     General: Bowel sounds are normal. There is no distension.     Palpations: Abdomen is soft. There is no mass.     Tenderness: There is no abdominal tenderness. There is no guarding or rebound.     Hernia: No hernia is present.  Skin:    General: Skin is warm and dry.  Neurological:     Mental Status: He is alert and oriented to person, place, and time.   Primary care notes reviewed  Assessment  Need for screening colonoscopy Plan  Patient is scheduled for screening colonoscopy on 12/17/2023.  The risks and benefits of the procedure including bleeding and perforation were fully explained to the patient, who gave informed consent.  Sutabs have been prescribed for bowel preparation.

## 2023-12-17 ENCOUNTER — Ambulatory Visit (HOSPITAL_COMMUNITY)
Admission: RE | Admit: 2023-12-17 | Discharge: 2023-12-17 | Disposition: A | Discharge status: Home / Self Care | Payer: Self-pay | Attending: General Surgery | Admitting: General Surgery

## 2023-12-17 ENCOUNTER — Encounter (HOSPITAL_COMMUNITY): Payer: Self-pay | Admitting: General Surgery

## 2023-12-17 ENCOUNTER — Encounter (HOSPITAL_COMMUNITY)
Admission: RE | Disposition: A | Discharge status: Home / Self Care | Payer: Self-pay | Source: Home / Self Care | Attending: General Surgery

## 2023-12-17 ENCOUNTER — Ambulatory Visit (HOSPITAL_COMMUNITY): Payer: Self-pay | Admitting: Anesthesiology

## 2023-12-17 ENCOUNTER — Other Ambulatory Visit: Payer: Self-pay

## 2023-12-17 DIAGNOSIS — Z1211 Encounter for screening for malignant neoplasm of colon: Secondary | ICD-10-CM | POA: Insufficient documentation

## 2023-12-17 DIAGNOSIS — I10 Essential (primary) hypertension: Secondary | ICD-10-CM | POA: Diagnosis not present

## 2023-12-17 DIAGNOSIS — Z538 Procedure and treatment not carried out for other reasons: Secondary | ICD-10-CM | POA: Diagnosis not present

## 2023-12-17 DIAGNOSIS — Z7984 Long term (current) use of oral hypoglycemic drugs: Secondary | ICD-10-CM | POA: Diagnosis not present

## 2023-12-17 DIAGNOSIS — E119 Type 2 diabetes mellitus without complications: Secondary | ICD-10-CM | POA: Insufficient documentation

## 2023-12-17 LAB — GLUCOSE, CAPILLARY: Glucose-Capillary: 148 mg/dL — ABNORMAL HIGH (ref 70–99)

## 2023-12-17 SURGERY — COLONOSCOPY
Anesthesia: Choice

## 2023-12-17 NOTE — Anesthesia Preprocedure Evaluation (Signed)
 Anesthesia Evaluation  Patient identified by MRN, date of birth, ID band Patient awake    Reviewed: Allergy & Precautions, NPO status , Patient's Chart, lab work & pertinent test results  Airway Mallampati: II  TM Distance: >3 FB Neck ROM: Full    Dental no notable dental hx. (+) Dental Advisory Given   Pulmonary neg pulmonary ROS   Pulmonary exam normal        Cardiovascular hypertension, Pt. on medications Normal cardiovascular exam     Neuro/Psych negative neurological ROS     GI/Hepatic Neg liver ROS, Bowel prep,,,  Endo/Other  diabetes, Type 2    Renal/GU negative Renal ROS  negative genitourinary   Musculoskeletal  (+) Arthritis ,    Abdominal Normal abdominal exam  (+)   Peds  Hematology negative hematology ROS (+)   Anesthesia Other Findings   Reproductive/Obstetrics                             Anesthesia Physical Anesthesia Plan  ASA: 2  Anesthesia Plan: General   Post-op Pain Management:    Induction: Intravenous  PONV Risk Score and Plan: Propofol infusion  Airway Management Planned: Natural Airway and Nasal Cannula  Additional Equipment:   Intra-op Plan:   Post-operative Plan:   Informed Consent: I have reviewed the patients History and Physical, chart, labs and discussed the procedure including the risks, benefits and alternatives for the proposed anesthesia with the patient or authorized representative who has indicated his/her understanding and acceptance.     Dental advisory given  Plan Discussed with: Anesthesiologist  Anesthesia Plan Comments:        Anesthesia Quick Evaluation

## 2023-12-17 NOTE — OR Nursing (Signed)
 Pt took Jardiance and metformin day of procedure and also ate oranges . Procedure cancelled per anesthesia and Dr Mavis

## 2024-01-16 DIAGNOSIS — R1312 Dysphagia, oropharyngeal phase: Secondary | ICD-10-CM | POA: Diagnosis not present

## 2024-01-16 DIAGNOSIS — E1129 Type 2 diabetes mellitus with other diabetic kidney complication: Secondary | ICD-10-CM | POA: Diagnosis not present

## 2024-01-16 DIAGNOSIS — Z6829 Body mass index (BMI) 29.0-29.9, adult: Secondary | ICD-10-CM | POA: Diagnosis not present

## 2024-01-16 DIAGNOSIS — E663 Overweight: Secondary | ICD-10-CM | POA: Diagnosis not present

## 2024-01-16 DIAGNOSIS — M79671 Pain in right foot: Secondary | ICD-10-CM | POA: Diagnosis not present

## 2024-01-23 ENCOUNTER — Encounter (INDEPENDENT_AMBULATORY_CARE_PROVIDER_SITE_OTHER): Payer: Self-pay | Admitting: *Deleted

## 2024-02-11 ENCOUNTER — Ambulatory Visit (INDEPENDENT_AMBULATORY_CARE_PROVIDER_SITE_OTHER): Admitting: Gastroenterology

## 2024-02-11 ENCOUNTER — Encounter (INDEPENDENT_AMBULATORY_CARE_PROVIDER_SITE_OTHER): Payer: Self-pay | Admitting: Gastroenterology

## 2024-02-11 VITALS — BP 152/73 | HR 57 | Temp 97.7°F | Ht 67.0 in | Wt 188.8 lb

## 2024-02-11 DIAGNOSIS — R7989 Other specified abnormal findings of blood chemistry: Secondary | ICD-10-CM | POA: Diagnosis not present

## 2024-02-11 DIAGNOSIS — Z860101 Personal history of adenomatous and serrated colon polyps: Secondary | ICD-10-CM

## 2024-02-11 DIAGNOSIS — F1090 Alcohol use, unspecified, uncomplicated: Secondary | ICD-10-CM | POA: Insufficient documentation

## 2024-02-11 DIAGNOSIS — K760 Fatty (change of) liver, not elsewhere classified: Secondary | ICD-10-CM | POA: Diagnosis not present

## 2024-02-11 DIAGNOSIS — R1310 Dysphagia, unspecified: Secondary | ICD-10-CM | POA: Diagnosis not present

## 2024-02-11 DIAGNOSIS — R748 Abnormal levels of other serum enzymes: Secondary | ICD-10-CM | POA: Insufficient documentation

## 2024-02-11 DIAGNOSIS — R1319 Other dysphagia: Secondary | ICD-10-CM | POA: Insufficient documentation

## 2024-02-11 DIAGNOSIS — Z860100 Personal history of colon polyps, unspecified: Secondary | ICD-10-CM | POA: Insufficient documentation

## 2024-02-11 MED ORDER — PANTOPRAZOLE SODIUM 40 MG PO TBEC
40.0000 mg | DELAYED_RELEASE_TABLET | Freq: Every day | ORAL | 3 refills | Status: AC
Start: 2024-02-11 — End: ?

## 2024-02-11 NOTE — Patient Instructions (Signed)
 It was very nice to meet you today, as dicussed with will plan for the following :  1) Upper endoscopy and colonoscopy  2) lab work and ultrasound

## 2024-02-11 NOTE — Progress Notes (Signed)
 David Harris David Harris , M.D. Gastroenterology & Hepatology [facility] [address] [place], [place] [address] Primary Care Physician: Bertell Satterfield, MD [address] [place] [address]  Chief Complaint: Dysphagia, surveillance colonoscopy, elevated liver enzymes  History of Present Illness: David Harris is a 58 y.o. male with diabetes who presents for evaluation of Dysphagia, surveillance colonoscopy, elevated liver enzymes.  Patient reports that for past couple months he has difficulty swallowing mostly solid food.  He reports this happens with eating meats where it would feel like sliding down in the upper chest.  Denies any history of food impactions.  Patient  reports reflux episodes for which he was taking PPI previously  Patient reports drinking liquor before but now he is drinking 2-3 beers daily.  Denies any herbal medication, family history of liver problems  The patient denies having any nausea, vomiting, fever, chills, hematochezia, melena, hematemesis, abdominal distention, abdominal pain, diarrhea, jaundice, pruritus or weight loss.  Lab work from 10/2023 hemoglobin 15.9 platelet 189 Elevated liver enzymes AST 66 ALT 77 Hemoglobin A1c 6.5  Last ZHI:wnwz Last Colonoscopy:2016  Prep satisfactory. Scattered diverticula at ascending and proximal transverse colon. 7 mm mucosal lipoma noted at rectum. 8 mm polyp hot snare from distal rectum. Small hemorrhoids below the dentate line. Recs  8mm rectal adenoma snared and it is tubular adenoma.  Next colonoscopy in 5 years.   FHx: neg for any gastrointestinal/liver disease, no malignancies Social: daily beer 2-3 Surgical: no abdominal surgeries  Past Medical History: Past Medical History:  Diagnosis Date   Arthritis    R knee   Diabetes mellitus without complication (HCC)    Hypertension    Rectal bleeding    Sleep concern    Had sleep study  sometime ago & told no need for CPAP   Vertigo     Past Surgical History: Past Surgical History:  Procedure Laterality Date   CIRCUMCISION     COLONOSCOPY N/A 06/13/2015   Procedure: COLONOSCOPY;  Surgeon: Claudis RAYMOND Rivet, MD;  Location: [facility];  Service: Endoscopy;  Laterality: N/A;  7:30    Family History: Family History  Problem Relation Age of Onset   Hypertension Mother    Stroke Mother    Hypertension Father    Kidney disease Father    Liver disease Father    Diabetes Father    Heart attack Father     Social History: Social History   Tobacco Use  Smoking Status Never  Smokeless Tobacco Never   Social History   Substance and Sexual Activity  Alcohol Use Yes   Comment: 3 times a week 3 beers   Social History   Substance and Sexual Activity  Drug Use No    Allergies: Allergies  Allergen Reactions   No Known Allergies     Medications: Current Outpatient Medications  Medication Sig Dispense Refill   amLODipine (NORVASC) 10 MG tablet Take 10 mg by mouth daily with breakfast.      aspirin EC 81 MG tablet Take 81 mg by mouth daily.     JARDIANCE 25 MG TABS tablet Take 25 mg by mouth daily.     losartan (COZAAR) 100 MG tablet Take 100 mg by mouth daily with breakfast.      metFORMIN (GLUCOPHAGE-XR) 500 MG 24 hr tablet Take 500 mg by mouth 2 (two) times daily.     metoprolol succinate (TOPROL XL) 50 MG 24 hr tablet Take 50 mg by  mouth daily with breakfast.      pantoprazole  (PROTONIX ) 40 MG tablet Take 1 tablet (40 mg total) by mouth daily. 60 tablet 3   No current facility-administered medications for this visit.    Review of Systems: GENERAL: negative for malaise, night sweats HEENT: No changes in hearing or vision, no nose bleeds or other nasal problems. NECK: Negative for lumps, goiter, pain and significant neck swelling RESPIRATORY: Negative for cough, wheezing CARDIOVASCULAR: Negative for chest pain, leg swelling, palpitations,  orthopnea GI: SEE HPI MUSCULOSKELETAL: Negative for joint pain or swelling, back pain, and muscle pain. SKIN: Negative for lesions, rash HEMATOLOGY Negative for prolonged bleeding, bruising easily, and swollen nodes. ENDOCRINE: Negative for cold or heat intolerance, polyuria, polydipsia and goiter. NEURO: negative for tremor, gait imbalance, syncope and seizures. The remainder of the review of systems is noncontributory.   Physical Exam: BP (!) 152/73   Pulse (!) 57   Temp 97.7 F (36.5 C)   Ht 5' 7 (1.702 m)   Wt 188 lb 12.8 oz (85.6 kg)   BMI 29.57 kg/m  GENERAL: The patient is AO x3, in no acute distress. HEENT: Head is normocephalic and atraumatic. EOMI are intact. Mouth is well hydrated and without lesions. NECK: Supple. No masses LUNGS: Clear to auscultation. No presence of rhonchi/wheezing/rales. Adequate chest expansion HEART: RRR, normal s1 and s2. ABDOMEN: Soft, nontender, no guarding, no peritoneal signs, and nondistended. BS +. No masses.  Imaging/Labs: as above     Latest Ref Rng & Units 05/22/2015    7:11 PM  CBC  WBC 4.0 - 10.5 K/uL 8.3   Hemoglobin 13.0 - 17.0 g/dL 86.0   Hematocrit 60.9 - 52.0 % 37.5   Platelets 150 - 400 K/uL 184    No results found for: IRON, TIBC, FERRITIN  I personally reviewed and interpreted the available labs, imaging and endoscopic files.  Ultrasound 2022  1. Diffusely increased liver parenchymal echogenicity which is  nonspecific but most commonly seen with hepatic steatosis. No focal  liver lesion identified.   2.  Bilateral renal cysts.   Impression and Plan: David Harris is a 58 y.o. male with diabetes who presents for evaluation of Dysphagia, surveillance colonoscopy, elevated liver enzymes.  #Dysphagia  Given history of chronic GERD this could be esophageal ring or stricture, however without tupper endoscopy unable to rule them out  Recommend starting PPI 30 minutes before breakfast  Upper  endoscopy with biopsy/dilation  - Cut food in small pieces and chew food thoroughly to avoid regurgitation episodes. - Discussed avoidance of eating within 3 hours of lying down to sleep and benefit of blocks to elevate head of bed. Also, will benefit from avoiding carbonated drinks/sodas or food that has tomatoes, spicy or greasy food.   #  Elevated liver enzymes  This is likely due to MASLD and AUD ; MetALD   Patient BMI is 29 with diabetes (hemoglobin A1c 6.5) and has had significant alcohol use daily  On exam patient does not have signs of advanced chronic liver disease, no splenomegaly, ascites, spider angiomas, palmar eythema    Given elevation in ALT /AST will obtain baseline viral hepatitis profile, AMA, and autoimmune serologies and Ultrasound   I discussed with patient the importance of alcohol cessation in detail      - walking at a brisk pace/biking at moderate intesity 2.5-5 hours per week     - use pedometer/step counter to track activity     - goal to lose 5-10%  of initial body weight     - avoid suagry drinks and juices, use zero calorie beverages     - increase water  intake     - eat a low carb diet with plenty of veggies and fruit     - Get sufficient sleep 7-8 hrs nightly     - maitain active lifestyle     - avoid alcohol     - recommend 2-3 cups Coffee daily     - Counsel on lowering cholesterol by having a diet rich in vegetables,          protein (avoid red meats) and good fats(fish, salmon).  # Surveillance colonoscopy  Patient had tubular adenoma polyps in 2016 suggest repeat was 5 years.  He is overdue  Will schedule colonoscopy along with upper endoscopy  I thoroughly discussed with the patient the procedure, including the risks involved. Patient understands what the procedure involves including the benefits and any risks. Patient understands alternatives to the proposed procedure. Risks including (but not limited to) bleeding, tearing of the lining  (perforation), rupture of adjacent organs, problems with heart and lung function, infection, and medication reactions. A small percentage of complications may require surgery, hospitalization, repeat endoscopic procedure, and/or transfusion.  Patient understood and agreed.     All questions were answered.      Marzell Isakson David Neena Beecham, MD Gastroenterology and Hepatology [facility]   This chart has been completed using [facility] Dictation software, and while attempts have been made to ensure accuracy , certain words and phrases may not be transcribed as intended

## 2024-02-12 ENCOUNTER — Telehealth: Payer: Self-pay | Admitting: *Deleted

## 2024-02-12 NOTE — Telephone Encounter (Signed)
 LMOVM to return call   US  scheduled for Tuesday 02/18/24 at [facility], arrive at 8:15 am, NPO after midnight.  TCS/EGD any room,w/Dr.Ahmed

## 2024-02-13 LAB — COMPREHENSIVE METABOLIC PANEL WITH GFR
ALT: 43 IU/L (ref 0–44)
AST: 34 IU/L (ref 0–40)
Albumin: 4.4 g/dL (ref 3.8–4.9)
Alkaline Phosphatase: 64 IU/L (ref 44–121)
BUN/Creatinine Ratio: 12 (ref 9–20)
BUN: 13 mg/dL (ref 6–24)
Bilirubin Total: 0.9 mg/dL (ref 0.0–1.2)
CO2: 20 mmol/L (ref 20–29)
Calcium: 9.6 mg/dL (ref 8.7–10.2)
Chloride: 102 mmol/L (ref 96–106)
Creatinine, Ser: 1.08 mg/dL (ref 0.76–1.27)
Globulin, Total: 2.2 g/dL (ref 1.5–4.5)
Glucose: 158 mg/dL — ABNORMAL HIGH (ref 70–99)
Potassium: 4.8 mmol/L (ref 3.5–5.2)
Sodium: 137 mmol/L (ref 134–144)
Total Protein: 6.6 g/dL (ref 6.0–8.5)
eGFR: 81 mL/min/1.73 (ref >59)

## 2024-02-13 LAB — HEPATITIS C ANTIBODY: Hep C Virus Ab: NONREACTIVE

## 2024-02-13 LAB — IRON,TIBC AND FERRITIN PANEL
Ferritin: 131 ng/mL (ref 30–400)
Iron Saturation: 36 % (ref 15–55)
Iron: 108 ug/dL (ref 38–169)
Total Iron Binding Capacity: 299 ug/dL (ref 250–450)
UIBC: 191 ug/dL (ref 111–343)

## 2024-02-13 LAB — MITOCHONDRIAL ANTIBODIES: Mitochondrial Ab: <20 U (ref 0.0–20.0)

## 2024-02-13 LAB — HIV ANTIBODY (ROUTINE TESTING W REFLEX): HIV Screen 4th Generation wRfx: NONREACTIVE

## 2024-02-13 LAB — ANTI-SMOOTH MUSCLE ANTIBODY, IGG: Smooth Muscle Ab: 6 U (ref 0–19)

## 2024-02-13 LAB — HEPATITIS B SURFACE ANTIGEN: Hepatitis B Surface Ag: NEGATIVE

## 2024-02-13 LAB — PROTIME-INR
INR: 0.9 (ref 0.9–1.2)
Prothrombin Time: 10.1 s (ref 9.1–12.0)

## 2024-02-13 LAB — HEPATITIS B SURFACE ANTIBODY,QUALITATIVE: Hep B Surface Ab, Qual: NONREACTIVE

## 2024-02-13 LAB — ANA: ANA Titer 1: NEGATIVE

## 2024-02-13 LAB — HEPATITIS B CORE ANTIBODY, TOTAL: Hep B Core Total Ab: NEGATIVE

## 2024-02-13 LAB — HEPATITIS A ANTIBODY, TOTAL: hep A Total Ab: NEGATIVE

## 2024-02-18 ENCOUNTER — Ambulatory Visit (INDEPENDENT_AMBULATORY_CARE_PROVIDER_SITE_OTHER): Payer: Self-pay | Admitting: Gastroenterology

## 2024-02-18 ENCOUNTER — Ambulatory Visit (HOSPITAL_COMMUNITY): Attending: Gastroenterology

## 2024-02-18 NOTE — Progress Notes (Signed)
  Hi David Harris ,   Can you please call the patient and tell the patient the lab work shows  slightly elevated liver enzymes but has improved since last time.  Also  you do not have any hepatitis A,B or C.  Your liver is working well. I do  recommend getting vaccinated against hepatitis A and B with your primary  care physician.  I look forward to seeing the results of your ultrasound   Thanks,  Sonja Manseau Faizan Horris Speros, MD  Gastroenterology and Hepatology  [facility]   ===============  ALT 43 AST 34 alk phos 64 T. bili normal  INR 0.9  Normal iron profile  Negative ANA, AMA, ASMA  Hep A nonimmune  Hep B not exposed nonimmune  Hep C negative  HIV negative

## 2024-02-26 ENCOUNTER — Encounter: Payer: Self-pay | Admitting: *Deleted

## 2024-02-26 ENCOUNTER — Other Ambulatory Visit: Payer: Self-pay | Admitting: *Deleted

## 2024-02-26 MED ORDER — PEG 3350-KCL-NA BICARB-NACL 420 G PO SOLR: 4000.0000 mL | Freq: Once | ORAL | 0 refills | Status: AC

## 2024-02-26 NOTE — Telephone Encounter (Signed)
 Pt has been scheduled for 03/13/24. Instructions mailed and prep sent to pharmacy. Pt states that his father in law just passed away and didn't want to reschedule the US . Advised pt that will send number to CS to call and reschedule US .

## 2024-03-13 ENCOUNTER — Encounter (HOSPITAL_COMMUNITY)
Admission: RE | Disposition: A | Discharge status: Home / Self Care | Payer: Self-pay | Source: Home / Self Care | Attending: Gastroenterology

## 2024-03-13 ENCOUNTER — Other Ambulatory Visit: Payer: Self-pay

## 2024-03-13 ENCOUNTER — Ambulatory Visit (HOSPITAL_COMMUNITY): Admitting: Anesthesiology

## 2024-03-13 ENCOUNTER — Ambulatory Visit (HOSPITAL_COMMUNITY)
Admission: RE | Admit: 2024-03-13 | Discharge: 2024-03-13 | Disposition: A | Discharge status: Home / Self Care | Attending: Gastroenterology | Admitting: Gastroenterology

## 2024-03-13 ENCOUNTER — Encounter (HOSPITAL_COMMUNITY): Payer: Self-pay | Admitting: Gastroenterology

## 2024-03-13 DIAGNOSIS — K648 Other hemorrhoids: Secondary | ICD-10-CM | POA: Insufficient documentation

## 2024-03-13 DIAGNOSIS — K562 Volvulus: Secondary | ICD-10-CM

## 2024-03-13 DIAGNOSIS — D120 Benign neoplasm of cecum: Secondary | ICD-10-CM | POA: Diagnosis not present

## 2024-03-13 DIAGNOSIS — K209 Esophagitis, unspecified without bleeding: Secondary | ICD-10-CM

## 2024-03-13 DIAGNOSIS — K5730 Diverticulosis of large intestine without perforation or abscess without bleeding: Secondary | ICD-10-CM | POA: Diagnosis not present

## 2024-03-13 DIAGNOSIS — K2950 Unspecified chronic gastritis without bleeding: Secondary | ICD-10-CM | POA: Insufficient documentation

## 2024-03-13 DIAGNOSIS — I10 Essential (primary) hypertension: Secondary | ICD-10-CM | POA: Diagnosis not present

## 2024-03-13 DIAGNOSIS — K3189 Other diseases of stomach and duodenum: Secondary | ICD-10-CM

## 2024-03-13 DIAGNOSIS — K2970 Gastritis, unspecified, without bleeding: Secondary | ICD-10-CM

## 2024-03-13 DIAGNOSIS — Z1211 Encounter for screening for malignant neoplasm of colon: Secondary | ICD-10-CM | POA: Insufficient documentation

## 2024-03-13 DIAGNOSIS — K2289 Other specified disease of esophagus: Secondary | ICD-10-CM | POA: Insufficient documentation

## 2024-03-13 DIAGNOSIS — Z860101 Personal history of adenomatous and serrated colon polyps: Secondary | ICD-10-CM

## 2024-03-13 DIAGNOSIS — R1310 Dysphagia, unspecified: Secondary | ICD-10-CM | POA: Diagnosis not present

## 2024-03-13 DIAGNOSIS — K635 Polyp of colon: Secondary | ICD-10-CM | POA: Diagnosis not present

## 2024-03-13 DIAGNOSIS — D122 Benign neoplasm of ascending colon: Secondary | ICD-10-CM | POA: Insufficient documentation

## 2024-03-13 DIAGNOSIS — K449 Diaphragmatic hernia without obstruction or gangrene: Secondary | ICD-10-CM | POA: Insufficient documentation

## 2024-03-13 DIAGNOSIS — E119 Type 2 diabetes mellitus without complications: Secondary | ICD-10-CM | POA: Diagnosis not present

## 2024-03-13 HISTORY — PX: ESOPHAGEAL DILATION: SHX303

## 2024-03-13 HISTORY — PX: ESOPHAGOGASTRODUODENOSCOPY: SHX5428

## 2024-03-13 HISTORY — PX: COLONOSCOPY: SHX5424

## 2024-03-13 LAB — HM COLONOSCOPY

## 2024-03-13 LAB — GLUCOSE, CAPILLARY: Glucose-Capillary: 166 mg/dL — ABNORMAL HIGH (ref 70–99)

## 2024-03-13 SURGERY — COLONOSCOPY
Anesthesia: General

## 2024-03-13 MED ORDER — SODIUM CHLORIDE (PF) 0.9 % IJ SOLN
PREFILLED_SYRINGE | INTRAVENOUS | Status: DC | PRN
  Administered 2024-03-13: 4 mL

## 2024-03-13 MED ORDER — EPHEDRINE SULFATE-NACL 50-0.9 MG/10ML-% IV SOSY
PREFILLED_SYRINGE | INTRAVENOUS | Status: DC | PRN
  Administered 2024-03-13 (×2): 10 mg via INTRAVENOUS

## 2024-03-13 MED ORDER — PROPOFOL 500 MG/50ML IV EMUL
INTRAVENOUS | Status: DC | PRN
  Administered 2024-03-13: 150 ug/kg/min via INTRAVENOUS

## 2024-03-13 MED ORDER — LIDOCAINE 2% (20 MG/ML) 5 ML SYRINGE
INTRAMUSCULAR | Status: DC | PRN
Start: 2024-03-13 — End: 2024-03-13
  Administered 2024-03-13: 50 mg via INTRAVENOUS

## 2024-03-13 MED ORDER — PROPOFOL 10 MG/ML IV BOLUS
INTRAVENOUS | Status: DC | PRN
  Administered 2024-03-13: 20 mg via INTRAVENOUS
  Administered 2024-03-13: 50 mg via INTRAVENOUS
  Administered 2024-03-13: 100 mg via INTRAVENOUS
  Administered 2024-03-13: 20 mg via INTRAVENOUS

## 2024-03-13 MED ORDER — LACTATED RINGERS IV SOLN: INTRAVENOUS | Status: DC

## 2024-03-13 NOTE — Op Note (Signed)
 [facility] Patient Name: David Harris Procedure Date: 03/13/2024 9:50 AM MRN: [id number] Date of Birth: [birth year] Attending MD: Deatrice Dine , MD, [id number] CSN: [id number] Age: 58 Admit Type: Outpatient Procedure:                Upper GI endoscopy Indications:              Dysphagia Providers:                Deatrice Dine, MD, Olam Ada, RN, Jon Loge Referring MD:              Medicines:                Monitored Anesthesia Care Complications:            No immediate complications. Estimated Blood Loss:     Estimated blood loss: none. Estimated blood loss                            was minimal. Procedure:                Pre-Anesthesia Assessment:                           - Prior to the procedure, a History and Physical                            was performed, and patient medications and                            allergies were reviewed. The patient's tolerance of                            previous anesthesia was also reviewed. The risks                            and benefits of the procedure and the sedation                            options and risks were discussed with the patient.                            All questions were answered, and informed consent                            was obtained. Prior Anticoagulants: The patient has                            taken no anticoagulant or antiplatelet agents. ASA                            Grade Assessment: II - A patient with mild systemic                            disease. After reviewing the risks and benefits,  the patient was deemed in satisfactory condition to                            undergo the procedure.                           After obtaining informed consent, the endoscope was                            passed under direct vision. Throughout the                            procedure, the patient's blood pressure, pulse, and                            oxygen  saturations were monitored continuously. The                            [id number] ([id number]) Upper was introduced through                            the mouth, and advanced to the second part of                            duodenum. The upper GI endoscopy was accomplished                            without difficulty. The patient tolerated the                            procedure well. Scope In: 10:01:07 AM Scope Out: 10:07:45 AM Total Procedure Duration: 0 hours 6 minutes 38 seconds  Findings:      The Z-line was irregular and was found in the lower third of the       esophagus. Biopsies were taken with a cold forceps for histology.      No endoscopic abnormality was evident in the esophagus to explain the       patient's complaint of dysphagia. It was decided, however, to proceed       with dilation of the middle third of the esophagus and of the lower       third of the esophagus. A TTS dilator was passed through the scope.       Dilation with a 15-16.5-18 mm balloon dilator was performed to 18 mm.       The dilation site was examined following endoscope reinsertion and       showed mild mucosal disruption.      A 3 cm hiatal hernia was present.      Mildly erythematous mucosa without bleeding was found in the gastric       antrum. Biopsies were taken with a cold forceps for histology.      Two 7 to 15 mm mucosal nodules were found in the duodenal bulb. The       nodules were Paris classification Is (protruding, sessile). Biopsies       were taken with a cold forceps for histology. Impression:               -  Z-line irregular, in the lower third of the                            esophagus. Biopsied.                           - No endoscopic esophageal abnormality to explain                            patient's dysphagia. Esophagus dilated. Dilated.                           - 3 cm hiatal hernia.                           - Erythematous mucosa in the antrum. Biopsied.                            - Mucosal nodule found in the duodenum. Biopsied. Moderate Sedation:      Per Anesthesia Care Recommendation:           - Patient has a contact number available for                            emergencies. The signs and symptoms of potential                            delayed complications were discussed with the                            patient. Return to normal activities tomorrow.                            Written discharge instructions were provided to the                            patient.                           - Await pathology results.                           -If nodule in the duodenum is a polyp ; would                            recommend repeat procedure with EMR Procedure Code(s):        --- Professional ---                           [phone], Esophagogastroduodenoscopy, flexible,                            transoral; with transendoscopic balloon dilation of                            esophagus (less than 30 mm diameter)  [id number], 59, Esophagogastroduodenoscopy, flexible,                            transoral; with biopsy, single or multiple Diagnosis Code(s):        --- Professional ---                           K22.89, Other specified disease of esophagus                           R13.10, Dysphagia, unspecified                           K44.9, Diaphragmatic hernia without obstruction or                            gangrene                           K31.89, Other diseases of stomach and duodenum CPT copyright 2022 American Medical Association. All rights reserved. The codes documented in this report are preliminary and upon coder review may  be revised to meet current compliance requirements. Deatrice Dine, MD Deatrice Dine, MD 03/13/2024 11:18:24 AM This report has been signed electronically. Number of Addenda: 0

## 2024-03-13 NOTE — Anesthesia Preprocedure Evaluation (Signed)
 Anesthesia Evaluation  Patient identified by MRN, date of birth, ID band Patient awake    Reviewed: Allergy & Precautions, H&P , NPO status , Patient's Chart, lab work & pertinent test results, reviewed documented beta blocker date and time   Airway Mallampati: II  TM Distance: >3 FB Neck ROM: full    Dental no notable dental hx.    Pulmonary neg pulmonary ROS   Pulmonary exam normal breath sounds clear to auscultation       Cardiovascular Exercise Tolerance: Good hypertension, negative cardio ROS  Rhythm:regular Rate:Normal     Neuro/Psych negative neurological ROS  negative psych ROS   GI/Hepatic negative GI ROS, Neg liver ROS,,,  Endo/Other  negative endocrine ROSdiabetes    Renal/GU negative Renal ROS  negative genitourinary   Musculoskeletal   Abdominal   Peds  Hematology negative hematology ROS (+)   Anesthesia Other Findings   Reproductive/Obstetrics negative OB ROS                             Anesthesia Physical Anesthesia Plan  ASA: 2  Anesthesia Plan: General   Post-op Pain Management:    Induction:   PONV Risk Score and Plan: Propofol infusion  Airway Management Planned:   Additional Equipment:   Intra-op Plan:   Post-operative Plan:   Informed Consent: I have reviewed the patients History and Physical, chart, labs and discussed the procedure including the risks, benefits and alternatives for the proposed anesthesia with the patient or authorized representative who has indicated his/her understanding and acceptance.     Dental Advisory Given  Plan Discussed with: CRNA  Anesthesia Plan Comments:        Anesthesia Quick Evaluation

## 2024-03-13 NOTE — Anesthesia Postprocedure Evaluation (Signed)
 Anesthesia Post Note  Patient: DELWIN RACZKOWSKI  Procedure(s) Performed: COLONOSCOPY EGD (ESOPHAGOGASTRODUODENOSCOPY) DILATION, ESOPHAGUS  Patient location during evaluation: Endoscopy Anesthesia Type: General Level of consciousness: awake Pain management: pain level controlled Vital Signs Assessment: post-procedure vital signs reviewed and stable Respiratory status: spontaneous breathing Cardiovascular status: stable Postop Assessment: no apparent nausea or vomiting Anesthetic complications: no   No notable events documented.   Last Vitals:  Vitals:   03/13/24 0804 03/13/24 1120  BP: (!) 145/74 115/76  Pulse: (!) 56 64  Resp: (!) 22 18  Temp: 36.6 C (!) 36.3 C  SpO2: 100% 99%    Last Pain:  Vitals:   03/13/24 1120  TempSrc: Oral  PainSc: 0-No pain                 Jonnatan Hanners

## 2024-03-13 NOTE — Op Note (Signed)
 [facility] Patient Name: David Harris Procedure Date: 03/13/2024 9:49 AM MRN: [id number] Date of Birth: 10-12-1965 Attending MD: Deatrice Dine , MD, [id number] CSN: [id number] Age: 58 Admit Type: Outpatient Procedure:                Colonoscopy Indications:              High risk colon cancer surveillance: Personal                            history of colonic polyps Providers:                Deatrice Dine, MD, Olam Ada, RN, Jon Loge Referring MD:              Medicines:                Monitored Anesthesia Care Complications:            No immediate complications. Estimated Blood Loss:     Estimated blood loss was minimal. Procedure:                Pre-Anesthesia Assessment:                           - Prior to the procedure, a History and Physical                            was performed, and patient medications and                            allergies were reviewed. The patient's tolerance of                            previous anesthesia was also reviewed. The risks                            and benefits of the procedure and the sedation                            options and risks were discussed with the patient.                            All questions were answered, and informed consent                            was obtained. Prior Anticoagulants: The patient has                            taken no anticoagulant or antiplatelet agents. ASA                            Grade Assessment: II - A patient with mild systemic                            disease. After reviewing the risks and benefits,  the patient was deemed in satisfactory condition to                            undergo the procedure.                           After obtaining informed consent, the colonoscope                            was passed under direct vision. Throughout the                            procedure, the patient's blood pressure, pulse, and                             oxygen saturations were monitored continuously. The                            PCF-HQ190L ([id number]) Peds Colon was introduced                            through the anus and advanced to the the cecum,                            identified by appendiceal orifice and ileocecal                            valve. The colonoscopy was technically difficult                            and complex due to significant looping. Successful                            completion of the procedure was aided by applying                            abdominal pressure. The patient tolerated the                            procedure well. The quality of the bowel                            preparation was evaluated using the BBPS [facility]                            Bowel Preparation Scale) with scores of: Right                            Colon = 3, Transverse Colon = 3 and Left Colon = 3                            (entire mucosa seen well with no residual staining,  small fragments of stool or opaque liquid). The                            total BBPS score equals 9. The ileocecal valve,                            appendiceal orifice, and rectum were photographed. Scope In: 10:11:47 AM Scope Out: 11:11:20 AM Scope Withdrawal Time: 0 hours 55 minutes 52 seconds  Total Procedure Duration: 0 hours 59 minutes 33 seconds  Findings:      A 25 mm polyp was found in the cecum. The polyp was semi-pedunculated.       Preparations were made for mucosal resection. Demarcation of the lesion       was performed with high-definition white light and narrow band imaging       to clearly identify the boundaries of the lesion. A 0.1 mg/mL solution       of epinephrine  was injected to raise the lesion. Piecemeal mucosal       resection using a snare was performed. Resection and retrieval were       complete. Resected tissue margins were examined and clear of polyp       tissue. To close a  defect after polypectomy, one hemostatic clip was       successfully placed (MR conditional). Clip manufacturer: [employer]. There was no bleeding at the end of the procedure.      A 15 mm polyp was found in the cecum. The polyp was sessile. The polyp       was removed with a hot snare. Resection and retrieval were complete.      A 10 mm polyp was found in the cecum. The polyp was sessile. The polyp       was removed with a hot snare. Resection and retrieval were complete.      A 10 mm polyp was found in the ascending colon. The polyp was sessile.       The polyp was removed with a hot snare. Resection and retrieval were       complete.      Scattered medium-mouthed diverticula were found in the entire colon.      Non-bleeding internal hemorrhoids were found during retroflexion. The       hemorrhoids were small. Impression:               - One 25 mm polyp in the cecum, removed with                            mucosal resection. Resected and retrieved. Clip (MR                            conditional) was placed. Clip manufacturer: [employer].                           - One 15 mm polyp in the cecum, removed with a hot  snare. Resected and retrieved.                           - One 10 mm polyp in the cecum, removed with a hot                            snare. Resected and retrieved.                           - One 10 mm polyp in the ascending colon, removed                            with a hot snare. Resected and retrieved. [employer]                            used to remove the polyps                           - Diverticulosis in the entire examined colon.                           - Non-bleeding internal hemorrhoids.                           - Mucosal resection was performed. Resection and                            retrieval were complete. Moderate Sedation:      Per Anesthesia Care Recommendation:           -  Patient has a contact number available for                            emergencies. The signs and symptoms of potential                            delayed complications were discussed with the                            patient. Return to normal activities tomorrow.                            Written discharge instructions were provided to the                            patient.                           - Resume previous diet.                           - Continue present medications.                           - Await pathology results.                           -  Repeat colonoscopy in 6 months for surveillance                            after piecemeal polypectomy and for surveillance                            based on pathology results.                           - Return to primary care physician as previously                            scheduled. Procedure Code(s):        --- Professional ---                           [phone], Colonoscopy, flexible; with endoscopic                            mucosal resection                           [id number], 59, Colonoscopy, flexible; with removal of                            tumor(s), polyp(s), or other lesion(s) by snare                            technique Diagnosis Code(s):        --- Professional ---                           Z86.010, Personal history of colonic polyps                           D12.0, Benign neoplasm of cecum                           D12.2, Benign neoplasm of ascending colon                           K64.8, Other hemorrhoids                           K57.30, Diverticulosis of large intestine without                            perforation or abscess without bleeding CPT copyright 2022 American Medical Association. All rights reserved. The codes documented in this report are preliminary and upon coder review may  be revised to meet current compliance requirements. Deatrice Dine, MD Deatrice Dine, MD 03/13/2024 11:26:33 AM This  report has been signed electronically. Number of Addenda: 0

## 2024-03-13 NOTE — H&P (Signed)
 Primary Care Physician:  Leonce Lucie JINNY DEVONNA Primary Gastroenterologist:  Dr. Cinderella  Pre-Procedure History & Physical: HPI:  David Harris is a 58 y.o. male with diabetes who presents for evaluation of Dysphagia, surveillance colonoscopy,    Patient reports that for past couple months he has difficulty swallowing mostly solid food.  He reports this happens with eating meats where it would feel like sliding down in the upper chest.  Denies any history of food impactions.  Patient  reports reflux episodes for which he was taking PPI previously   Patient reports drinking liquor before but now he is drinking 2-3 beers daily.  Denies any herbal medication, family history of liver problems   The patient denies having any nausea, vomiting, fever, chills, hematochezia, melena, hematemesis, abdominal distention, abdominal pain, diarrhea, jaundice, pruritus or weight loss.   Lab work from 10/2023 hemoglobin 15.9 platelet 189 Elevated liver enzymes AST 66 ALT 77 Hemoglobin A1c 6.5   Last ZHI:wnwz Last Colonoscopy:2016  Prep satisfactory. Scattered diverticula at ascending and proximal transverse colon. 7 mm mucosal lipoma noted at rectum. 8 mm polyp hot snare from distal rectum. Small hemorrhoids below the dentate line. Recs   8mm rectal adenoma snared and it is tubular adenoma.  Next colonoscopy in 5 years.    FHx: neg for any gastrointestinal/liver disease, no malignancies Social: daily beer 2-3 Surgical: no abdominal surgeries  Past Medical History:  Diagnosis Date   Arthritis    R knee   Diabetes mellitus without complication (HCC)    Hypertension    Rectal bleeding    Sleep concern    Had sleep study sometime ago & told no need for CPAP   Vertigo     Past Surgical History:  Procedure Laterality Date   CIRCUMCISION     COLONOSCOPY N/A 06/13/2015   Procedure: COLONOSCOPY;  Surgeon: Claudis RAYMOND Rivet, MD;  Location: [facility];  Service: Endoscopy;  Laterality: N/A;   7:30    Prior to Admission medications   Medication Sig Start Date End Date Taking? Authorizing Provider  amLODipine (NORVASC) 10 MG tablet Take 10 mg by mouth daily with breakfast.  03/01/15  Yes [provider]  aspirin EC 81 MG tablet Take 81 mg by mouth daily.   Yes [provider]  losartan (COZAAR) 100 MG tablet Take 100 mg by mouth daily with breakfast.  03/01/15  Yes [provider]  metFORMIN (GLUCOPHAGE-XR) 500 MG 24 hr tablet Take 500 mg by mouth 2 (two) times daily.   Yes [provider]  metoprolol succinate (TOPROL XL) 50 MG 24 hr tablet Take 50 mg by mouth daily with breakfast.  03/01/15  Yes [provider]  pantoprazole  (PROTONIX ) 40 MG tablet Take 1 tablet (40 mg total) by mouth daily. 02/11/24  Yes Geraldyn Shain F, MD  JARDIANCE 25 MG TABS tablet Take 25 mg by mouth daily. 10/28/23   [provider]    Allergies as of 02/26/2024 - Review Complete 02/11/2024  Allergen Reaction Noted   No known allergies  07/16/2016    Family History  Problem Relation Age of Onset   Hypertension Mother    Stroke Mother    Hypertension Father    Kidney disease Father    Liver disease Father    Diabetes Father    Heart attack Father     Social History   Socioeconomic History   Marital status: Married    Spouse name: Not on file   Number of children:  4   Years of education: HS   Highest education level: Not on file  Occupational History   Not on file  Tobacco Use   Smoking status: Never   Smokeless tobacco: Never  Vaping Use   Vaping status: Never Used  Substance and Sexual Activity   Alcohol use: Yes    Comment: 3 times a week 3 beers   Drug use: No   Sexual activity: Yes  Other Topics Concern   Not on file  Social History Narrative   Lives at home with his wife.   Left-handed.   No caffeine use.   Social Drivers of Corporate investment banker Strain: Not on file  Food Insecurity: Not on file  Transportation  Needs: Not on file  Physical Activity: Not on file  Stress: Not on file  Social Connections: Not on file  Intimate Partner Violence: Not on file    Review of Systems: See HPI, otherwise negative ROS  Physical Exam: Vital signs in last 24 hours: Temp:  [97.9 F (36.6 C)] 97.9 F (36.6 C) (09/19 0804) Pulse Rate:  [56] 56 (09/19 0804) Resp:  [22] 22 (09/19 0804) BP: (145)/(74) 145/74 (09/19 0804) SpO2:  [100 %] 100 % (09/19 0804) Weight:  [86.2 kg] 86.2 kg (09/19 0804)   General:   Alert,  Well-developed, well-nourished, pleasant and cooperative in NAD Head:  Normocephalic and atraumatic. Eyes:  Sclera clear, no icterus.   Conjunctiva pink. Ears:  Normal auditory acuity. Nose:  No deformity, discharge,  or lesions. Msk:  Symmetrical without gross deformities. Normal posture. Extremities:  Without clubbing or edema. Neurologic:  Alert and  oriented x4;  grossly normal neurologically. Skin:  Intact without significant lesions or rashes. Psych:  Alert and cooperative. Normal mood and affect.  Impression/Plan: JUN OSMENT is a 58 y.o. male with diabetes who presents for evaluation of Dysphagia, surveillance colonoscopy,. Proceed with upper endoscopy with dilation and Colonoscopy   The risks of the procedure including infection, bleed, or perforation as well as benefits, limitations, alternatives and imponderables have been reviewed with the patient. Questions have been answered. All parties agreeable.

## 2024-03-13 NOTE — Discharge Instructions (Signed)
   Discharge instructions Please read the instructions outlined below and refer to this sheet in the next few weeks. These discharge instructions provide you with general information on caring for yourself after you leave the hospital. Your doctor may also give you specific instructions. While your treatment has been planned according to the most current medical practices available, unavoidable complications occasionally occur. If you have any problems or questions after discharge, please call your doctor. ACTIVITY You may resume your regular activity but move at a slower pace for the next 24 hours.  Take frequent rest periods for the next 24 hours.  Walking will help expel (get rid of) the air and reduce the bloated feeling in your abdomen.  No driving for 24 hours (because of the anesthesia (medicine) used during the test).  You may shower.  Do not sign any important legal documents or operate any machinery for 24 hours (because of the anesthesia used during the test).  NUTRITION Drink plenty of fluids.  You may resume your normal diet.  Begin with a light meal and progress to your normal diet.  Avoid alcoholic beverages for 24 hours or as instructed by your caregiver.  MEDICATIONS You may resume your normal medications unless your caregiver tells you otherwise.  WHAT YOU CAN EXPECT TODAY You may experience abdominal discomfort such as a feeling of fullness or "gas" pains.  FOLLOW-UP Your doctor will discuss the results of your test with you.  SEEK IMMEDIATE MEDICAL ATTENTION IF ANY OF THE FOLLOWING OCCUR: Excessive nausea (feeling sick to your stomach) and/or vomiting.  Severe abdominal pain and distention (swelling).  Trouble swallowing.  Temperature over 101 F (37.8 C).  Rectal bleeding or vomiting of blood.    Avoid using high dose aspirin including Goody/BC powders, NSAIDs such as Aleve, ibuprofen, naproxen, Motrin, Voltaren or Advil (even the topical ones)    I hope you have a  great rest of your week!   Kaceton Vieau Faizan Ayushi Pla , M.D.. Gastroenterology and Hepatology [facility]

## 2024-03-13 NOTE — Transfer of Care (Signed)
 Immediate Anesthesia Transfer of Care Note  Patient: David Harris  Procedure(s) Performed: COLONOSCOPY EGD (ESOPHAGOGASTRODUODENOSCOPY) DILATION, ESOPHAGUS  Patient Location: PACU and Endoscopy Unit  Anesthesia Type:General  Level of Consciousness: awake  Airway & Oxygen Therapy: Patient Spontanous Breathing  Post-op Assessment: Report given to RN  Post vital signs: Reviewed and stable  Last Vitals:  Vitals Value Taken Time  BP    Temp    Pulse    Resp    SpO2      Last Pain:  Vitals:   03/13/24 0804  TempSrc: Oral  PainSc: 0-No pain      Patients Stated Pain Goal: 7 (03/13/24 0804)  Complications: No notable events documented.

## 2024-03-15 DIAGNOSIS — K635 Polyp of colon: Secondary | ICD-10-CM

## 2024-03-15 DIAGNOSIS — K2970 Gastritis, unspecified, without bleeding: Secondary | ICD-10-CM

## 2024-03-16 ENCOUNTER — Encounter (HOSPITAL_COMMUNITY): Payer: Self-pay | Admitting: Gastroenterology

## 2024-03-16 ENCOUNTER — Ambulatory Visit (INDEPENDENT_AMBULATORY_CARE_PROVIDER_SITE_OTHER): Payer: Self-pay | Admitting: Gastroenterology

## 2024-03-16 LAB — SURGICAL PATHOLOGY

## 2024-03-17 ENCOUNTER — Encounter (INDEPENDENT_AMBULATORY_CARE_PROVIDER_SITE_OTHER): Payer: Self-pay | Admitting: *Deleted

## 2024-03-20 NOTE — Progress Notes (Signed)
 6 mth TCS noted in recall\ Patient result letter mailed

## 2024-05-07 ENCOUNTER — Encounter (INDEPENDENT_AMBULATORY_CARE_PROVIDER_SITE_OTHER): Payer: Self-pay | Admitting: Gastroenterology
